# Patient Record
Sex: Male | Born: 1975 | Race: White | Hispanic: No | Marital: Single | Smoking: Current every day smoker
Health system: Southern US, Community
[De-identification: ages and names within clinical notes are randomized; demographics above are authoritative.]

## PROBLEM LIST (undated history)

## (undated) VITALS — BP 117/80 | HR 92 | Temp 97.6°F | Resp 16 | Ht 68.2 in | Wt 175.0 lb

## (undated) DIAGNOSIS — E782 Mixed hyperlipidemia: Secondary | ICD-10-CM

## (undated) DIAGNOSIS — E785 Hyperlipidemia, unspecified: Secondary | ICD-10-CM

## (undated) DIAGNOSIS — I10 Essential (primary) hypertension: Secondary | ICD-10-CM

## (undated) DIAGNOSIS — F329 Major depressive disorder, single episode, unspecified: Secondary | ICD-10-CM

## (undated) DIAGNOSIS — R0789 Other chest pain: Secondary | ICD-10-CM

## (undated) DIAGNOSIS — F3162 Bipolar disorder, current episode mixed, moderate: Secondary | ICD-10-CM

## (undated) DIAGNOSIS — F419 Anxiety disorder, unspecified: Secondary | ICD-10-CM

## (undated) DIAGNOSIS — F32A Depression, unspecified: Secondary | ICD-10-CM

## (undated) HISTORY — DX: Bipolar disorder, current episode mixed, moderate: F31.62

## (undated) HISTORY — DX: Hyperlipidemia, unspecified: E78.5

## (undated) HISTORY — DX: Depression, unspecified: F32.A

## (undated) HISTORY — DX: Other chest pain: R07.89

## (undated) HISTORY — DX: Anxiety disorder, unspecified: F41.9

## (undated) HISTORY — DX: Essential (primary) hypertension: I10

## (undated) HISTORY — DX: Major depressive disorder, single episode, unspecified: F32.9

## (undated) HISTORY — DX: Mixed hyperlipidemia: E78.2

---

## 2013-10-13 ENCOUNTER — Encounter: Payer: Self-pay | Admitting: Emergency Medicine

## 2013-10-13 ENCOUNTER — Ambulatory Visit (INDEPENDENT_AMBULATORY_CARE_PROVIDER_SITE_OTHER): Payer: BC Managed Care – PPO | Admitting: Emergency Medicine

## 2013-10-13 VITALS — BP 168/118 | HR 102 | Temp 97.8°F | Resp 18 | Ht 68.25 in | Wt 204.0 lb

## 2013-10-13 DIAGNOSIS — F17200 Nicotine dependence, unspecified, uncomplicated: Secondary | ICD-10-CM

## 2013-10-13 DIAGNOSIS — F3162 Bipolar disorder, current episode mixed, moderate: Secondary | ICD-10-CM

## 2013-10-13 DIAGNOSIS — Z111 Encounter for screening for respiratory tuberculosis: Secondary | ICD-10-CM

## 2013-10-13 DIAGNOSIS — E559 Vitamin D deficiency, unspecified: Secondary | ICD-10-CM

## 2013-10-13 DIAGNOSIS — Z1212 Encounter for screening for malignant neoplasm of rectum: Secondary | ICD-10-CM

## 2013-10-13 DIAGNOSIS — E782 Mixed hyperlipidemia: Secondary | ICD-10-CM

## 2013-10-13 DIAGNOSIS — F4323 Adjustment disorder with mixed anxiety and depressed mood: Secondary | ICD-10-CM

## 2013-10-13 DIAGNOSIS — I10 Essential (primary) hypertension: Secondary | ICD-10-CM

## 2013-10-13 DIAGNOSIS — Z0000 Encounter for general adult medical examination without abnormal findings: Secondary | ICD-10-CM

## 2013-10-13 DIAGNOSIS — Z113 Encounter for screening for infections with a predominantly sexual mode of transmission: Secondary | ICD-10-CM

## 2013-10-13 DIAGNOSIS — Z23 Encounter for immunization: Secondary | ICD-10-CM

## 2013-10-13 DIAGNOSIS — Z79899 Other long term (current) drug therapy: Secondary | ICD-10-CM

## 2013-10-13 LAB — HEMOGLOBIN A1C
Hgb A1c MFr Bld: 5.4 % (ref <5.7)
MEAN PLASMA GLUCOSE: 108 mg/dL (ref <117)

## 2013-10-13 LAB — CBC WITH DIFFERENTIAL/PLATELET
BASOS ABS: 0 10*3/uL (ref 0.0–0.1)
Basophils Relative: 0 % (ref 0–1)
EOS PCT: 5 % (ref 0–5)
Eosinophils Absolute: 1.1 10*3/uL (ref 0.0–0.7)
HCT: 48.6 % (ref 39.0–52.0)
Hemoglobin: 17.1 g/dL — ABNORMAL HIGH (ref 13.0–17.0)
Lymphocytes Relative: 13 % (ref 12–46)
Lymphs Abs: 2.7 10*3/uL (ref 0.7–4.0)
MCH: 32.8 pg (ref 26.0–34.0)
MCHC: 35.2 g/dL (ref 30.0–36.0)
MCV: 93.3 fL (ref 78.0–100.0)
Monocytes Absolute: 1.1 10*3/uL (ref 0.1–1.0)
Monocytes Relative: 5 % (ref 3–12)
NEUTROS ABS: 16.2 10*3/uL (ref 1.7–7.7)
Neutrophils Relative %: 77 % (ref 43–77)
PLATELETS: 287 10*3/uL (ref 150–400)
RBC: 5.21 MIL/uL (ref 4.22–5.81)
RDW: 12.4 % (ref 11.5–15.5)
WBC: 21.1 10*3/uL — ABNORMAL HIGH (ref 4.0–10.5)

## 2013-10-13 MED ORDER — LISINOPRIL 20 MG PO TABS: 20.0000 mg | ORAL_TABLET | Freq: Every day | ORAL | Status: DC

## 2013-10-13 MED ORDER — DIAZEPAM 2 MG PO TABS: 2.0000 mg | ORAL_TABLET | Freq: Two times a day (BID) | ORAL | Status: DC | PRN

## 2013-10-13 MED ORDER — ATENOLOL 50 MG PO TABS: 50.0000 mg | ORAL_TABLET | Freq: Every day | ORAL | Status: DC

## 2013-10-13 NOTE — Patient Instructions (Signed)
Mood Disorders  Mood disorders are conditions that affect the way a person feels emotionally. The main mood disorders include:  · Depression.  · Bipolar disorder.  · Dysthymia. Dysthymia is a mild, lasting (chronic) depression. Symptoms of dysthymia are similar to depression, but not as severe.  · Cyclothymia. Cyclothymia includes mood swings, but the highs and lows are not as severe as they are in bipolar disorder. Symptoms of cyclothymia are similar to those of bipolar disorder, but less extreme.  CAUSES   Mood disorders are probably caused by a combination of factors. People with mood disorders seem to have physical and chemical changes in their brains. Mood disorders run in families, so there may be genetic causes. Severe trauma or stressful life events may also increase the risk of mood disorders.   SYMPTOMS   Symptoms of mood disorders depend on the specific type of condition.  Depression symptoms include:  · Feeling sad, worthless, or hopeless.  · Negative thoughts.  · Inability to enjoy one's usual activities.  · Low energy.  · Sleeping too much or too little.  · Appetite changes.  · Crying.  · Concentration problems.  · Thoughts of harming oneself.  Bipolar disorder symptoms include:  · Periods of depression (see above symptoms).  · Mood swings, from sadness and depression, to abnormal elation and excitement.  · Periods of mania:  · Racing thoughts.  · Fast speech.  · Poor judgment, and careless, dangerous choices.  · Decreased need for sleep.  · Risky behavior.  · Difficulty concentrating.  · Irritability.  · Increased energy.  · Increased sex drive.  DIAGNOSIS   There are no blood tests or X-rays that can confirm a mood disorder. However, your caregiver may choose to run some tests to make sure that there is not another physical cause for your symptoms. A mood disorder is usually diagnosed after an in-depth interview with a caregiver.  TREATMENT   Mood disorders can be treated with one or more of the  following:  · Medicine. This may include antidepressants, mood-stabilizers, or anti-psychotics.  · Psychotherapy (talk therapy).  · Cognitive behavioral therapy. You are taught to recognize negative thoughts and behavior patterns, and replace them with healthy thoughts and behaviors.  · Electroconvulsive therapy. For very severe cases of deep depression, a series of treatments in which an electrical current is applied to the brain.  · Vagus nerve stimulation. A pulse of electricity is applied to a portion of the brain.  · Transcranial magnetic stimulation. Powerful magnets are placed on the head that produce electrical currents.  · Hospitalization. In severe situations, or when someone is having serious thoughts of harming him or herself, hospitalization may be necessary in order to keep the person safe. This is also done to quickly start and monitor treatment.  HOME CARE INSTRUCTIONS   · Take your medicine exactly as directed.  · Attend all of your therapy sessions.  · Try to eat regular, healthy meals.  · Exercise daily. Exercise may improve mood symptoms.  · Get good sleep.  · Do not drink alcohol or use pot or other drugs. These can worsen mood symptoms and cause anxiety and psychosis.  · Tell your caregiver if you develop any side effects, such as feeling sick to your stomach (nauseous), dry mouth, dizziness, constipation, drowsiness, tremor, weight gain, or sexual symptoms. He or she may suggest things you can do to improve symptoms.  · Learn ways to cope with the stress of having a   thoughts of hurting yourself or others.  You cannot care for yourself.  You develop the sensation of hearing or seeing  something that is not actually present (auditory or visual hallucinations).  You develop abnormal thoughts. Document Released: 06/11/2009 Document Revised: 11/06/2011 Document Reviewed: 06/11/2009 [facility] Patient Information 2014 [place], [place]. Fat and Cholesterol Control Diet Fat and cholesterol levels in your blood and organs are influenced by your diet. High levels of fat and cholesterol may lead to diseases of the heart, small and large blood vessels, gallbladder, liver, and pancreas. CONTROLLING FAT AND CHOLESTEROL WITH DIET Although exercise and lifestyle factors are important, your diet is key. That is because certain foods are known to raise cholesterol and others to lower it. The goal is to balance foods for their effect on cholesterol and more importantly, to replace saturated and trans fat with other types of fat, such as monounsaturated fat, polyunsaturated fat, and omega-3 fatty acids. On average, a person should consume no more than 15 to 17 g of saturated fat daily. Saturated and trans fats are considered "bad" fats, and they will raise LDL cholesterol. Saturated fats are primarily found in animal products such as meats, butter, and cream. However, that does not mean you need to give up all your favorite foods. Today, there are good tasting, low-fat, low-cholesterol substitutes for most of the things you like to eat. Choose low-fat or nonfat alternatives. Choose round or loin cuts of red meat. These types of cuts are lowest in fat and cholesterol. Chicken (without the skin), fish, veal, and ground [place] breast are great choices. Eliminate fatty meats, such as hot dogs and salami. Even shellfish have little or no saturated fat. Have a 3 oz (85 g) portion when you eat lean meat, poultry, or fish. Trans fats are also called "partially hydrogenated oils." They are oils that have been scientifically manipulated so that they are solid at room temperature resulting in a longer shelf life and  improved taste and texture of foods in which they are added. Trans fats are found in stick margarine, some tub margarines, cookies, crackers, and baked goods.  When baking and cooking, oils are a great substitute for butter. The monounsaturated oils are especially beneficial since it is believed they lower LDL and raise HDL. The oils you should avoid entirely are saturated tropical oils, such as coconut and palm.  Remember to eat a lot from food groups that are naturally free of saturated and trans fat, including fish, fruit, vegetables, beans, grains (barley, rice, couscous, bulgur wheat), and pasta (without cream sauces).  IDENTIFYING FOODS THAT LOWER FAT AND CHOLESTEROL  Soluble fiber may lower your cholesterol. This type of fiber is found in fruits such as apples, vegetables such as broccoli, potatoes, and carrots, legumes such as beans, peas, and lentils, and grains such as barley. Foods fortified with plant sterols (phytosterol) may also lower cholesterol. You should eat at least 2 g per day of these foods for a cholesterol lowering effect.  Read package labels to identify low-saturated fats, trans fat free, and low-fat foods at the supermarket. Select cheeses that have only 2 to 3 g saturated fat per ounce. Use a heart-healthy tub margarine that is free of trans fats or partially hydrogenated oil. When buying baked goods (cookies, crackers), avoid partially hydrogenated oils. Breads and muffins should be made from whole grains (whole-wheat or whole oat flour, instead of "flour" or "enriched flour"). Buy non-creamy canned soups with reduced salt and no added fats.  FOOD PREPARATION  TECHNIQUES  Never deep-fry. If you must fry, either stir-fry, which uses very little fat, or use non-stick cooking sprays. When possible, broil, bake, or roast meats, and steam vegetables. Instead of putting butter or margarine on vegetables, use lemon and herbs, applesauce, and cinnamon (for squash and sweet potatoes). Use  nonfat yogurt, salsa, and low-fat dressings for salads.  LOW-SATURATED FAT / LOW-FAT FOOD SUBSTITUTES Meats / Saturated Fat (g)  Avoid: Steak, marbled (3 oz/85 g) / 11 g  Choose: Steak, lean (3 oz/85 g) / 4 g  Avoid: Hamburger (3 oz/85 g) / 7 g  Choose: Hamburger, lean (3 oz/85 g) / 5 g  Avoid: Ham (3 oz/85 g) / 6 g  Choose: Ham, lean cut (3 oz/85 g) / 2.4 g  Avoid: Chicken, with skin, dark meat (3 oz/85 g) / 4 g  Choose: Chicken, skin removed, dark meat (3 oz/85 g) / 2 g  Avoid: Chicken, with skin, light meat (3 oz/85 g) / 2.5 g  Choose: Chicken, skin removed, light meat (3 oz/85 g) / 1 g Dairy / Saturated Fat (g)  Avoid: Whole milk (1 cup) / 5 g  Choose: Low-fat milk, 2% (1 cup) / 3 g  Choose: Low-fat milk, 1% (1 cup) / 1.5 g  Choose: Skim milk (1 cup) / 0.3 g  Avoid: Hard cheese (1 oz/28 g) / 6 g  Choose: Skim milk cheese (1 oz/28 g) / 2 to 3 g  Avoid: Cottage cheese, 4% fat (1 cup) / 6.5 g  Choose: Low-fat cottage cheese, 1% fat (1 cup) / 1.5 g  Avoid: Ice cream (1 cup) / 9 g  Choose: Sherbet (1 cup) / 2.5 g  Choose: Nonfat frozen yogurt (1 cup) / 0.3 g  Choose: Frozen fruit bar / trace  Avoid: Whipped cream (1 tbs) / 3.5 g  Choose: Nondairy whipped topping (1 tbs) / 1 g Condiments / Saturated Fat (g)  Avoid: Mayonnaise (1 tbs) / 2 g  Choose: Low-fat mayonnaise (1 tbs) / 1 g  Avoid: Butter (1 tbs) / 7 g  Choose: Extra light margarine (1 tbs) / 1 g  Avoid: Coconut oil (1 tbs) / 11.8 g  Choose: Olive oil (1 tbs) / 1.8 g  Choose: Corn oil (1 tbs) / 1.7 g  Choose: Safflower oil (1 tbs) / 1.2 g  Choose: Sunflower oil (1 tbs) / 1.4 g  Choose: Soybean oil (1 tbs) / 2.4 g  Choose: Canola oil (1 tbs) / 1 g Document Released: 08/14/2005 Document Revised: 12/09/2012 Document Reviewed: 02/02/2011 [employer] Patient Information 2014 [place], [place]. Hypertension Hypertension is another name for high blood pressure. High blood pressure may mean that  your heart needs to work harder to pump blood. Blood pressure consists of two numbers, which includes a higher number over a lower number (example: 110/72). HOME CARE   Make lifestyle changes as told by your doctor. This may include weight loss and exercise.  Take your blood pressure medicine every day.  Limit how much salt you use.  Stop smoking if you smoke.  Do not use drugs.  Talk to your doctor if you are using decongestants or birth control pills. These medicines might make blood pressure higher.  Females should not drink more than 1 alcoholic drink per day. Males should not drink more than 2 alcoholic drinks per day.  See your doctor as told. GET HELP RIGHT AWAY IF:   You have a blood pressure reading with a top number of 180 or  higher.  You get a very bad headache.  You get blurred or changing vision.  You feel confused.  You feel weak, numb, or faint.  You get chest or belly (abdominal) pain.  You throw up (vomit).  You cannot breathe very well. MAKE SURE YOU:   Understand these instructions.  Will watch your condition.  Will get help right away if you are not doing well or get worse. Document Released: 01/31/2008 Document Revised: 11/06/2011 Document Reviewed: 01/31/2008 [facility] Patient Information 2014 [place], [place]. Smoking Cessation, Tips for Success If you are ready to quit smoking, congratulations! You have chosen to help yourself be healthier. Cigarettes bring nicotine, tar, carbon monoxide, and other irritants into your body. Your lungs, heart, and blood vessels will be able to work better without these poisons. There are many different ways to quit smoking. Nicotine gum, nicotine patches, a nicotine inhaler, or nicotine nasal spray can help with physical craving. Hypnosis, support groups, and medicines help break the habit of smoking. WHAT THINGS CAN I DO TO MAKE QUITTING EASIER?  Here are some tips to help you quit for good:  Pick a date when you  will quit smoking completely. Tell all of your friends and family about your plan to quit on that date.  Do not try to slowly cut down on the number of cigarettes you are smoking. Pick a quit date and quit smoking completely starting on that day.  Throw away all cigarettes.   Clean and remove all ashtrays from your home, work, and car.   On a card, write down your reasons for quitting. Carry the card with you and read it when you get the urge to smoke.   Cleanse your body of nicotine. Drink enough water and fluids to keep your urine clear or pale yellow. Do this after quitting to flush the nicotine from your body.   Learn to predict your moods. Do not let a bad situation be your excuse to have a cigarette. Some situations in your life might tempt you into wanting a cigarette.   Never have "just one" cigarette. It leads to wanting another and another. Remind yourself of your decision to quit.   Change habits associated with smoking. If you smoked while driving or when feeling stressed, try other activities to replace smoking. Stand up when drinking your coffee. Brush your teeth after eating. Sit in a different chair when you read the paper. Avoid alcohol while trying to quit, and try to drink fewer caffeinated beverages. Alcohol and caffeine may urge you to smoke.   Avoid foods and drinks that can trigger a desire to smoke, such as sugary or spicy foods and alcohol.   Ask people who smoke not to smoke around you.   Have something planned to do right after eating or having a cup of coffee. For example, plan to take a walk or exercise.   Try a relaxation exercise to calm you down and decrease your stress. Remember, you may be tense and nervous for the first 2 weeks after you quit, but this will pass.   Find new activities to keep your hands busy. Play with a pen, coin, or rubber band. Doodle or draw things on paper.   Brush your teeth right after eating. This will help cut down  on the craving for the taste of tobacco after meals. You can also try mouthwash.   Use oral substitutes in place of cigarettes. Try using lemon drops, carrots, cinnamon sticks, or chewing gum. Keep  them handy so they are available when you have the urge to smoke.   When you have the urge to smoke, try deep breathing.   Designate your home as a nonsmoking area.   If you are a heavy smoker, ask your health care provider about a prescription for nicotine chewing gum. It can ease your withdrawal from nicotine.   Reward yourself. Set aside the cigarette money you save and buy yourself something nice.   Look for support from others. Join a support group or smoking cessation program. Ask someone at home or at work to help you with your plan to quit smoking.   Always ask yourself, "Do I need this cigarette or is this just a reflex?" Tell yourself, "Today, I choose not to smoke," or "I do not want to smoke." You are reminding yourself of your decision to quit.  Do not replace cigarette smoking with electronic cigarettes (commonly called e-cigarettes). The safety of e-cigarettes is unknown, and some may contain harmful chemicals.  If you relapse, do not give up! Plan ahead and think about what you will do the next time you get the urge to smoke.  HOW WILL I FEEL WHEN I QUIT SMOKING? You may have symptoms of withdrawal because your body is used to nicotine (the addictive substance in cigarettes). You may crave cigarettes, be irritable, feel very hungry, cough often, get headaches, or have difficulty concentrating. The withdrawal symptoms are only temporary. They are strongest when you first quit but will go away within 10 14 days. When withdrawal symptoms occur, stay in control. Think about your reasons for quitting. Remind yourself that these are signs that your body is healing and getting used to being without cigarettes. Remember that withdrawal symptoms are easier to treat than the major  diseases that smoking can cause.  Even after the withdrawal is over, expect periodic urges to smoke. However, these cravings are generally short lived and will go away whether you smoke or not. Do not smoke!  WHAT RESOURCES ARE AVAILABLE TO HELP ME QUIT SMOKING? Your health care provider can direct you to community resources or hospitals for support, which may include:  Group support.  Education.  Hypnosis.  Therapy. Document Released: 05/12/2004 Document Revised: 06/04/2013 Document Reviewed: 01/30/2013 [facility] Patient Information 2014 [place], [place].

## 2013-10-13 NOTE — Progress Notes (Signed)
Subjective:    Patient ID: Eric Bentley, male    DOB: 1976/02/09, 38 y.o.   MRN: [id number]  HPI Comments: 38 yo male to establish as new patient. He has multiple concerns and significant PMH that he admits to be noncompliant with. He reports history of HTN, Hypercholesterolemia. He admits he d/c all Meds in 01/2013 because he felt tired. He has not been checking his BP. He denies any CV symptoms. He notes he has significant FMHX with fatal MI in mother in early life and brother with NONfatal mI in his 6320's. He notes they both were obese, inactive and smoked. He notes he smokes and chews tobacco. He denies routine dental or Eye evaluations.He notes his last Cholesterol/ TG was in the 700's at Urgent care. He keeps active with work in [employer]. He does note he drinks 3-5 cups coffee daily.  He notes + diagnosis with Bipolar/ schizophrenia in the past. He has not been on any meds for a long time. He was on Risperdal @20  but notes made him feel worse so d/c. He has tried xanax which makes him feel buzzed. He used to be on Valium in the pat which helped to keep him more even keel and have less quick agitation. He has not been followed by psych due to cost.   He notes he keeps chronic allergy/ sinus congestion with a.m. Production of color. He notes symptoms worse with smoking tobacco. He notes mild frontal pressure on/ off. He does not take any OTC for relief.   He is concerned because he keeps having dark nodules occur on different parts of his body. He denies any new changes/ exposures. He notes they will itch and change in size randomly.  He notes he has RUQ on/off. He notes ETOH makes it feel better. He denies any food triggers or bowel changes.    Hyperlipidemia  Hypertension      Review of Systems  HENT: Positive for congestion and sinus pressure.   Gastrointestinal: Positive for abdominal pain.  Skin: Positive for color change.  Psychiatric/Behavioral: Positive for agitation.  Negative for suicidal ideas, hallucinations and dysphoric mood. The patient is not nervous/anxious.   All other systems reviewed and are negative.   BP 168/118  Pulse 102  Temp(Src) 97.8 F (36.6 C) (Temporal)  Resp 18  Ht 5' 8.25" (1.734 m)  Wt 204 lb (92.534 kg)  BMI 30.78 kg/m2     Objective:   Physical Exam  Nursing note and vitals reviewed. Constitutional: He is oriented to person, place, and time. He appears well-developed and well-nourished.  HENT:  Head: Normocephalic and atraumatic.  Right Ear: External ear normal.  Left Ear: External ear normal.  Nose: Nose normal.  Mouth/Throat: No oropharyngeal exudate.  Cloudy TM's bilaterally   Eyes: Conjunctivae and EOM are normal. Pupils are equal, round, and reactive to light. Right eye exhibits no discharge. Left eye exhibits no discharge. No scleral icterus.  Neck: Normal range of motion. Neck supple. No JVD present. No tracheal deviation present. No thyromegaly present.  Cardiovascular: Normal rate, regular rhythm, normal heart sounds and intact distal pulses.   Pulmonary/Chest: Effort normal and breath sounds normal.  Abdominal: Soft. Bowel sounds are normal. He exhibits no distension and no mass. There is no tenderness. There is no rebound and no guarding.  Genitourinary: Rectum normal, prostate normal and penis normal. Guaiac negative stool. No penile tenderness.  Musculoskeletal: Normal range of motion. He exhibits no edema and no tenderness.  Lymphadenopathy:  He has no cervical adenopathy.  Neurological: He is alert and oriented to person, place, and time. He has normal reflexes. No cranial nerve deficit. He exhibits normal muscle tone. Coordination normal.  Skin: Skin is warm and dry. No rash noted. No erythema. No pallor.  Scattered 5 mm hard elevated papules with mild darkening of skin  Psychiatric: He has a normal mood and affect. His behavior is normal. Judgment and thought content normal.           Assessment & Plan:  1. NEW PAtient to establish and CPE- Update screening labs/ History/ Immunizations/ Testing as needed. Advised healthy diet, QD exercise, increase H20 and continue RX/ Vitamins AD. Request records from previous provider to update chart. ADVISED Needs eye/ dental screening 2. Mood disorder- Restart Valium 2 mg AD but will need to re-establish with Psych with DX in PMH of Schizophrenia/ Bipolar, w/c if SX increase or ER. 3.Noncompliant HTN, Cholesterol- Needs healthy diet, cardio QD and obtain healthy weight. Check Labs, Check BP if >130/80 call office, Restart Lisinopril 20 mg AD and Add Atenolol 50 mg AD, w/c if SX increase or ER. Recheck BP Friday OV and EKG recheck with ? Sinus Tachy 4. Tobacco dependence- Oral/ Smoke- Cessation advised. 5. Chronic sinusitis per PT- Check labs may need ct sinus to evaluate. 6. Dark nodules/ skin changes- advised needs to monitor  And may need derm referral 7. RUQ pain- monitor if reoccurs w/c for abd u/s, hygiene advised with diet/ ETOH OVER 40 minutes of exam, counseling, chart review, referral performed

## 2013-10-14 ENCOUNTER — Encounter: Payer: Self-pay | Admitting: Emergency Medicine

## 2013-10-14 ENCOUNTER — Other Ambulatory Visit: Payer: Self-pay | Admitting: Emergency Medicine

## 2013-10-14 DIAGNOSIS — F3162 Bipolar disorder, current episode mixed, moderate: Secondary | ICD-10-CM

## 2013-10-14 DIAGNOSIS — E782 Mixed hyperlipidemia: Secondary | ICD-10-CM

## 2013-10-14 DIAGNOSIS — I10 Essential (primary) hypertension: Secondary | ICD-10-CM | POA: Insufficient documentation

## 2013-10-14 HISTORY — DX: Essential (primary) hypertension: I10

## 2013-10-14 HISTORY — DX: Bipolar disorder, current episode mixed, moderate: F31.62

## 2013-10-14 HISTORY — DX: Mixed hyperlipidemia: E78.2

## 2013-10-14 LAB — HEPATIC FUNCTION PANEL
ALT: 29 U/L (ref 0–53)
AST: 17 U/L (ref 0–37)
Albumin: 4.4 g/dL (ref 3.5–5.2)
Alkaline Phosphatase: 61 U/L (ref 39–117)
BILIRUBIN DIRECT: 0.1 mg/dL (ref 0.0–0.3)
BILIRUBIN INDIRECT: 0.3 mg/dL (ref 0.2–1.2)
Total Bilirubin: 0.4 mg/dL (ref 0.2–1.2)
Total Protein: 6.6 g/dL (ref 6.0–8.3)

## 2013-10-14 LAB — URINALYSIS, ROUTINE W REFLEX MICROSCOPIC
BILIRUBIN URINE: NEGATIVE
GLUCOSE, UA: NEGATIVE mg/dL
Hgb urine dipstick: NEGATIVE
Ketones, ur: NEGATIVE mg/dL
Leukocytes, UA: NEGATIVE
Nitrite: NEGATIVE
PH: 5 (ref 5.0–8.0)
PROTEIN: NEGATIVE mg/dL
Specific Gravity, Urine: 1.023 (ref 1.005–1.030)
Urobilinogen, UA: 0.2 mg/dL (ref 0.0–1.0)

## 2013-10-14 LAB — HSV(HERPES SIMPLEX VRS) I + II AB-IGG: HSV 2 Glycoprotein G Ab, IgG: <0.1 IV

## 2013-10-14 LAB — RPR

## 2013-10-14 LAB — MICROALBUMIN / CREATININE URINE RATIO
CREATININE, URINE: 123 mg/dL
MICROALB UR: 0.63 mg/dL (ref 0.00–1.89)
MICROALB/CREAT RATIO: 5.1 mg/g (ref 0.0–30.0)

## 2013-10-14 LAB — BASIC METABOLIC PANEL WITH GFR
BUN: 19 mg/dL (ref 6–23)
CHLORIDE: 103 meq/L (ref 96–112)
CO2: 24 mEq/L (ref 19–32)
Calcium: 9.5 mg/dL (ref 8.4–10.5)
Creat: 1.1 mg/dL (ref 0.50–1.35)
GFR, EST NON AFRICAN AMERICAN: 84 mL/min
GFR, Est African American: >89 mL/min
Glucose, Bld: 98 mg/dL (ref 70–99)
POTASSIUM: 4.7 meq/L (ref 3.5–5.3)
Sodium: 140 mEq/L (ref 135–145)

## 2013-10-14 LAB — HIV ANTIBODY (ROUTINE TESTING W REFLEX): HIV: NONREACTIVE

## 2013-10-14 LAB — PSA: PSA: 0.76 ng/mL (ref <=4.00)

## 2013-10-14 LAB — LIPID PANEL
Cholesterol: 229 mg/dL — ABNORMAL HIGH (ref 0–200)
HDL: 55 mg/dL (ref >39)
Total CHOL/HDL Ratio: 4.2 Ratio
Triglycerides: 461 mg/dL — ABNORMAL HIGH (ref <150)

## 2013-10-14 LAB — MAGNESIUM: MAGNESIUM: 2.1 mg/dL (ref 1.5–2.5)

## 2013-10-14 LAB — GC PROBE AMPLIFICATION, URINE: GC Probe Amp, Urine: NEGATIVE

## 2013-10-14 LAB — HEPATITIS PANEL, ACUTE
HCV Ab: NEGATIVE
HEP B C IGM: NONREACTIVE
Hep A IgM: NONREACTIVE
Hepatitis B Surface Ag: NEGATIVE

## 2013-10-14 LAB — TSH: TSH: 0.544 u[IU]/mL (ref 0.350–4.500)

## 2013-10-14 LAB — INSULIN, FASTING: INSULIN FASTING, SERUM: 28 u[IU]/mL (ref 3–28)

## 2013-10-14 LAB — TESTOSTERONE: TESTOSTERONE: 341 ng/dL (ref 300–890)

## 2013-10-14 LAB — VITAMIN D 25 HYDROXY (VIT D DEFICIENCY, FRACTURES): VIT D 25 HYDROXY: 29 ng/mL — AB (ref 30–89)

## 2013-10-14 MED ORDER — DOXYCYCLINE HYCLATE 100 MG PO TABS: 100.0000 mg | ORAL_TABLET | Freq: Two times a day (BID) | ORAL | Status: DC

## 2013-10-16 LAB — TB SKIN TEST
Induration: 0 mm
TB Skin Test: NEGATIVE

## 2013-10-17 ENCOUNTER — Encounter: Payer: Self-pay | Admitting: Emergency Medicine

## 2013-10-17 ENCOUNTER — Ambulatory Visit (INDEPENDENT_AMBULATORY_CARE_PROVIDER_SITE_OTHER): Payer: BC Managed Care – PPO | Admitting: Emergency Medicine

## 2013-10-17 VITALS — BP 132/84 | HR 90 | Temp 97.8°F | Resp 18 | Ht 68.25 in | Wt 202.0 lb

## 2013-10-17 DIAGNOSIS — R7989 Other specified abnormal findings of blood chemistry: Secondary | ICD-10-CM

## 2013-10-17 DIAGNOSIS — R9431 Abnormal electrocardiogram [ECG] [EKG]: Secondary | ICD-10-CM

## 2013-10-17 DIAGNOSIS — F17200 Nicotine dependence, unspecified, uncomplicated: Secondary | ICD-10-CM

## 2013-10-17 DIAGNOSIS — I10 Essential (primary) hypertension: Secondary | ICD-10-CM

## 2013-10-17 NOTE — Progress Notes (Signed)
   Subjective:    Patient ID: Eric Bentley, male    DOB: [birth year], 38 y.o.   MRN: [id number]  HPI Comments: 38 yo male with close f/u HTN with restart of Lisinopril and Atenolol. He is feeling a little better. He did start the Valium and is only using as needed. He notes started Doxycyline for elevated WBC and chronic sinus infection. He has stopped cigarettes but restarted chewing. He has decreased ETOH.   Hyperlipidemia  Hypertension    Current Outpatient Prescriptions on File Prior to Visit  Medication Sig Dispense Refill  . atenolol (TENORMIN) 50 MG tablet Take 1 tablet (50 mg total) by mouth daily.  30 tablet  3  . diazepam (VALIUM) 2 MG tablet Take 1 tablet (2 mg total) by mouth every 12 (twelve) hours as needed for anxiety.  60 tablet  0  . doxycycline (VIBRA-TABS) 100 MG tablet Take 1 tablet (100 mg total) by mouth 2 (two) times daily.  20 tablet  0  . lisinopril (PRINIVIL,ZESTRIL) 20 MG tablet Take 1 tablet (20 mg total) by mouth daily.  30 tablet  3   No current facility-administered medications on file prior to visit.   No Known Allergies Past Medical History  Diagnosis Date  . Hyperlipidemia   . Hypertension   . Depression   . Anxiety   . Unspecified essential hypertension 10/14/2013  . Mixed hyperlipidemia 10/14/2013  . Bipolar 1 disorder, mixed, moderate 10/14/2013     Review of Systems  All other systems reviewed and are negative.   BP 132/84  Pulse 90  Temp(Src) 97.8 F (36.6 C) (Temporal)  Resp 18  Ht 5' 8.25" (1.734 m)  Wt 202 lb (91.627 kg)  BMI 30.47 kg/m2     Objective:   Physical Exam  Nursing note and vitals reviewed. Constitutional: He is oriented to person, place, and time. He appears well-developed and well-nourished.  HENT:  Head: Normocephalic and atraumatic.  Right Ear: External ear normal.  Left Ear: External ear normal.  Nose: Nose normal.  Mouth/Throat: Oropharynx is clear and moist.  Eyes: Conjunctivae are normal.  Neck:  Normal range of motion.  Cardiovascular: Normal rate, regular rhythm, normal heart sounds and intact distal pulses.   Pulmonary/Chest: Effort normal and breath sounds normal.  Abdominal: Soft.  Musculoskeletal: Normal range of motion.  Lymphadenopathy:    He has no cervical adenopathy.  Neurological: He is alert and oriented to person, place, and time.  Skin: Skin is warm and dry.  Psychiatric: He has a normal mood and affect. His behavior is normal. Judgment and thought content normal.      EKG WNL    Assessment & Plan:  1. HTN- Check BP call if >130/80, increase cardio 2. Abnormal EKG recheck with new RX Atenolo with + improvement, continue the same 3. Abnormal CBC with probable chronic lung/ sinusitis continue ABX AD and recheck labs 1 month Dr Posey BoyerMckewon 3. Bipolar/ schizophrenia HX- Advised patient needs Psychiatrist for medication management. He declines at this time noting symptoms are controlled. He notes he rarely uses Valium on extreme agitation only. Advised QD medication would probably be more beneficial but declines at this time.

## 2013-10-17 NOTE — Patient Instructions (Signed)
Smoking Cessation Quitting smoking is important to your health and has many advantages. However, it is not always easy to quit since nicotine is a very addictive drug. Often times, people try 3 times or more before being able to quit. This document explains the best ways for you to prepare to quit smoking. Quitting takes hard work and a lot of effort, but you can do it. ADVANTAGES OF QUITTING SMOKING  You will live longer, feel better, and live better.  Your body will feel the impact of quitting smoking almost immediately.  Within 20 minutes, blood pressure decreases. Your pulse returns to its normal level.  After 8 hours, carbon monoxide levels in the blood return to normal. Your oxygen level increases.  After 24 hours, the chance of having a heart attack starts to decrease. Your breath, hair, and body stop smelling like smoke.  After 48 hours, damaged nerve endings begin to recover. Your sense of taste and smell improve.  After 72 hours, the body is virtually free of nicotine. Your bronchial tubes relax and breathing becomes easier.  After 2 to 12 weeks, lungs can hold more air. Exercise becomes easier and circulation improves.  The risk of having a heart attack, stroke, cancer, or lung disease is greatly reduced.  After 1 year, the risk of coronary heart disease is cut in half.  After 5 years, the risk of stroke falls to the same as a nonsmoker.  After 10 years, the risk of lung cancer is cut in half and the risk of other cancers decreases significantly.  After 15 years, the risk of coronary heart disease drops, usually to the level of a nonsmoker.  If you are pregnant, quitting smoking will improve your chances of having a healthy baby.  The people you live with, especially any children, will be healthier.  You will have extra money to spend on things other than cigarettes. QUESTIONS TO THINK ABOUT BEFORE ATTEMPTING TO QUIT You may want to talk about your answers with your  caregiver.  Why do you want to quit?  If you tried to quit in the past, what helped and what did not?  What will be the most difficult situations for you after you quit? How will you plan to handle them?  Who can help you through the tough times? Your family? Friends? A caregiver?  What pleasures do you get from smoking? What ways can you still get pleasure if you quit? Here are some questions to ask your caregiver:  How can you help me to be successful at quitting?  What medicine do you think would be best for me and how should I take it?  What should I do if I need more help?  What is smoking withdrawal like? How can I get information on withdrawal? GET READY  Set a quit date.  Change your environment by getting rid of all cigarettes, ashtrays, matches, and lighters in your home, car, or work. Do not let people smoke in your home.  Review your past attempts to quit. Think about what worked and what did not. GET SUPPORT AND ENCOURAGEMENT You have a better chance of being successful if you have help. You can get support in many ways.  Tell your family, friends, and co-workers that you are going to quit and need their support. Ask them not to smoke around you.  Get individual, group, or telephone counseling and support. Programs are available at local hospitals and health centers. Call your local health department for   information about programs in your area.  Spiritual beliefs and practices may help some smokers quit.  Download a "quit meter" on your computer to keep track of quit statistics, such as how long you have gone without smoking, cigarettes not smoked, and money saved.  Get a self-help book about quitting smoking and staying off of tobacco. LEARN NEW SKILLS AND BEHAVIORS  Distract yourself from urges to smoke. Talk to someone, go for a walk, or occupy your time with a task.  Change your normal routine. Take a different route to work. Drink tea instead of coffee.  Eat breakfast in a different place.  Reduce your stress. Take a hot bath, exercise, or read a book.  Plan something enjoyable to do every day. Reward yourself for not smoking.  Explore interactive web-based programs that specialize in helping you quit. GET MEDICINE AND USE IT CORRECTLY Medicines can help you stop smoking and decrease the urge to smoke. Combining medicine with the above behavioral methods and support can greatly increase your chances of successfully quitting smoking.  Nicotine replacement therapy helps deliver nicotine to your body without the negative effects and risks of smoking. Nicotine replacement therapy includes nicotine gum, lozenges, inhalers, nasal sprays, and skin patches. Some may be available over-the-counter and others require a prescription.  Antidepressant medicine helps people abstain from smoking, but how this works is unknown. This medicine is available by prescription.  Nicotinic receptor partial agonist medicine simulates the effect of nicotine in your brain. This medicine is available by prescription. Ask your caregiver for advice about which medicines to use and how to use them based on your health history. Your caregiver will tell you what side effects to look out for if you choose to be on a medicine or therapy. Carefully read the information on the package. Do not use any other product containing nicotine while using a nicotine replacement product.  RELAPSE OR DIFFICULT SITUATIONS Most relapses occur within the first 3 months after quitting. Do not be discouraged if you start smoking again. Remember, most people try several times before finally quitting. You may have symptoms of withdrawal because your body is used to nicotine. You may crave cigarettes, be irritable, feel very hungry, cough often, get headaches, or have difficulty concentrating. The withdrawal symptoms are only temporary. They are strongest when you first quit, but they will go away within  10 14 days. To reduce the chances of relapse, try to:  Avoid drinking alcohol. Drinking lowers your chances of successfully quitting.  Reduce the amount of caffeine you consume. Once you quit smoking, the amount of caffeine in your body increases and can give you symptoms, such as a rapid heartbeat, sweating, and anxiety.  Avoid smokers because they can make you want to smoke.  Do not let weight gain distract you. Many smokers will gain weight when they quit, usually less than 10 pounds. Eat a healthy diet and stay active. You can always lose the weight gained after you quit.  Find ways to improve your mood other than smoking. FOR MORE INFORMATION  www.smokefree.gov  Document Released: 08/08/2001 Document Revised: 02/13/2012 Document Reviewed: 11/23/2011 [employer] Patient Information 2014 [employer].  

## 2013-11-16 DIAGNOSIS — E559 Vitamin D deficiency, unspecified: Secondary | ICD-10-CM | POA: Insufficient documentation

## 2013-11-16 DIAGNOSIS — Z79899 Other long term (current) drug therapy: Secondary | ICD-10-CM | POA: Insufficient documentation

## 2013-11-16 NOTE — Progress Notes (Deleted)
   Subjective:    Patient ID: Eric Bentley, male    DOB: [birth year], 38 y.o.   MRN: [id number]  HPI  Review of Systems     Objective:   Physical Exam        Assessment & Plan:

## 2013-11-17 ENCOUNTER — Encounter: Payer: Self-pay | Admitting: Internal Medicine

## 2013-11-17 NOTE — Progress Notes (Signed)
Patient ID: Eric DallyJoseph Windish, male   DOB: 07-06-1976, 38 y.o.   MRN: [id number] Error

## 2013-11-24 ENCOUNTER — Encounter: Payer: Self-pay | Admitting: Internal Medicine

## 2013-12-08 ENCOUNTER — Encounter: Payer: Self-pay | Admitting: Internal Medicine

## 2013-12-08 ENCOUNTER — Ambulatory Visit (INDEPENDENT_AMBULATORY_CARE_PROVIDER_SITE_OTHER): Payer: BC Managed Care – PPO | Admitting: Internal Medicine

## 2013-12-08 VITALS — BP 146/96 | HR 88 | Temp 98.2°F | Resp 16 | Ht 68.75 in | Wt 207.8 lb

## 2013-12-08 DIAGNOSIS — R7309 Other abnormal glucose: Secondary | ICD-10-CM

## 2013-12-08 DIAGNOSIS — E782 Mixed hyperlipidemia: Secondary | ICD-10-CM

## 2013-12-08 DIAGNOSIS — Z79899 Other long term (current) drug therapy: Secondary | ICD-10-CM

## 2013-12-08 DIAGNOSIS — R7303 Prediabetes: Secondary | ICD-10-CM | POA: Insufficient documentation

## 2013-12-08 DIAGNOSIS — F419 Anxiety disorder, unspecified: Secondary | ICD-10-CM

## 2013-12-08 DIAGNOSIS — I10 Essential (primary) hypertension: Secondary | ICD-10-CM

## 2013-12-08 LAB — CBC WITH DIFFERENTIAL/PLATELET
BASOS PCT: 1 % (ref 0–1)
Basophils Absolute: 0.1 10*3/uL (ref 0.0–0.1)
EOS ABS: 0.7 10*3/uL (ref 0.0–0.7)
Eosinophils Relative: 9 % — ABNORMAL HIGH (ref 0–5)
HEMATOCRIT: 44.5 % (ref 39.0–52.0)
HEMOGLOBIN: 15.8 g/dL (ref 13.0–17.0)
LYMPHS ABS: 1.9 10*3/uL (ref 0.7–4.0)
Lymphocytes Relative: 26 % (ref 12–46)
MCH: 32.5 pg (ref 26.0–34.0)
MCHC: 35.5 g/dL (ref 30.0–36.0)
MCV: 91.6 fL (ref 78.0–100.0)
MONOS PCT: 6 % (ref 3–12)
Monocytes Absolute: 0.4 10*3/uL (ref 0.1–1.0)
Neutro Abs: 4.2 10*3/uL (ref 1.7–7.7)
Neutrophils Relative %: 58 % (ref 43–77)
Platelets: 242 10*3/uL (ref 150–400)
RBC: 4.86 MIL/uL (ref 4.22–5.81)
RDW: 12.5 % (ref 11.5–15.5)
WBC: 7.3 10*3/uL (ref 4.0–10.5)

## 2013-12-08 LAB — LIPID PANEL
CHOLESTEROL: 253 mg/dL — AB (ref 0–200)
HDL: 56 mg/dL (ref >39)
Total CHOL/HDL Ratio: 4.5 Ratio
Triglycerides: 544 mg/dL — ABNORMAL HIGH (ref <150)

## 2013-12-08 MED ORDER — GARLIC 400 MG PO TBEC: 400.0000 mg | DELAYED_RELEASE_TABLET | Freq: Every day | ORAL | Status: DC

## 2013-12-08 MED ORDER — MULTI-VITAMIN/MINERALS PO TABS: 1.0000 | ORAL_TABLET | Freq: Every day | ORAL | Status: AC

## 2013-12-08 MED ORDER — VITAMIN D3 50 MCG (2000 UT) PO CAPS: 2000.0000 [IU] | ORAL_CAPSULE | Freq: Every day | ORAL | Status: DC

## 2013-12-08 MED ORDER — KRILL OIL 300 MG PO CAPS: 300.0000 mg | ORAL_CAPSULE | Freq: Every day | ORAL | Status: DC

## 2013-12-08 MED ORDER — CITALOPRAM HYDROBROMIDE 20 MG PO TABS: 20.0000 mg | ORAL_TABLET | Freq: Every day | ORAL | Status: DC

## 2013-12-08 MED ORDER — FISH OIL 1200 MG PO CAPS: 1000.0000 mg | ORAL_CAPSULE | Freq: Every day | ORAL | Status: DC

## 2013-12-08 NOTE — Progress Notes (Signed)
   Subjective:    Patient ID: Eric Bentley, male    DOB: [birth year], 38 y.o.   MRN: [id number]  HPI A very nice 38 yo SWM with labile HTN, HLD, preDiabetes and Anxiety disorder presents for F/U of Labile HTN relating having stopped his BP meds 2-3 days after starting them due to orthostatic dizziness & lightheadedness .   Medication List      diazepam 2 MG tablet  Commonly known as:  VALIUM  Take 1 tablet (2 mg total) by mouth every 12 (twelve) hours as needed for anxiety.     Fish Oil 1200 MG Caps  Take 0.8333 capsules (1,000 mg total) by mouth daily.     Garlic 400 MG Tbec  Commonly known as:  GARLIQUE  Take 1 tablet (400 mg total) by mouth daily.     Krill Oil 300 MG Caps  Take 1 capsule (300 mg total) by mouth daily.     multivitamin with minerals tablet  Take 1 tablet by mouth daily.     Vitamin D3 2000 UNITS capsule  Take 1 capsule (2,000 Units total) by mouth daily.      No Known Allergies  Past Medical History  Diagnosis Date  . Hyperlipidemia   . Hypertension   . Depression   . Anxiety   . Unspecified essential hypertension 10/14/2013  . Mixed hyperlipidemia 10/14/2013  . Bipolar 1 disorder, mixed, moderate 10/14/2013   Review of Systems In addition to the HPI above,  No Fever-chills,  No Headache, No changes with Vision or hearing,  No problems swallowing food or Liquids,  No Chest pain or productive Cough or Shortness of Breath,  No Abdominal pain, No Nausea or Vommitting, Bowel movements are regular,  No Blood in stool or Urine,  No dysuria,  No new skin rashes or bruises,  No new joints pains-aches,  No new weakness, tingling, numbness in any extremity,  No recent weight loss,  No polyuria, polydypsia or polyphagia,  No significant Mental Stressors.  A full 10 point Review of Systems was done, except as stated above, all other Review of Systems were negative  Objective:   Physical Exam  BP 146/96 - rechecked at 130/90  Pulse 88  Temp 98.2 F    Resp 16  Ht 5' 8.75"   Wt 207 lb 12.8 oz   BMI 30.92 kg/m2  HEENT - Eac's patent. TM's Nl.EOM's full. PERRLA. NasoOroPharynx clear. Neck - supple. Nl Thyroid. No bruits nodes JVD Chest - Clear equal BS Cor - Nl HS. RRR w/o sig MGR. PP 1(+) No edema. Abd - No palpable organomegaly, masses or tenderness. BS nl. MS- FROM. w/o deformities. Muscle power tone and bulk Nl. Gait Nl. Neuro - No obvious Cr N abnormalities. Sensory, motor and Cerebellar functions appear Nl w/o focal abnormalities.  Assessment & Plan:   1. Hypertension - Recc restarting his Atenolol 50 mg at 1/2 = 25 mg qam and his Lisinopril 20 mg at 1/2 = 10 mg qpm and monitoring BP's bid.  2. Mixed hyperlipidemia - Lipid panel  3. PreDiabetes  4. Encounter for long-term (current) use of other medications - CBC with Differential  5. Chronic Anxiety - Recc trial on low dose Citalopram 20 mg qd  ROV 1 mo to reassess

## 2013-12-08 NOTE — Progress Notes (Deleted)
Patient ID: Eric Bentley, male   DOB: 11/21/1975, 38 y.o.   MRN: [id number]

## 2013-12-08 NOTE — Patient Instructions (Addendum)
Recommend Take your Atenolol 50 mg 1/@ tablet (=25 mg) each morning  And   Lisinopril 20 mg 1/2 tablet (=10 mg) at nite      Hypertension As your heart beats, it forces blood through your arteries. This force is your blood pressure. If the pressure is too high, it is called hypertension (HTN) or high blood pressure. HTN is dangerous because you may have it and not know it. High blood pressure may mean that your heart has to work harder to pump blood. Your arteries may be narrow or stiff. The extra work puts you at risk for heart disease, stroke, and other problems.  Blood pressure consists of two numbers, a higher number over a lower, 110/72, for example. It is stated as "110 over 72." The ideal is below 120 for the top number (systolic) and under 80 for the bottom (diastolic). Write down your blood pressure today. You should pay close attention to your blood pressure if you have certain conditions such as:  Heart failure.  Prior heart attack.  Diabetes  Chronic kidney disease.  Prior stroke.  Multiple risk factors for heart disease. To see if you have HTN, your blood pressure should be measured while you are seated with your arm held at the level of the heart. It should be measured at least twice. A one-time elevated blood pressure reading (especially in the Emergency Department) does not mean that you need treatment. There may be conditions in which the blood pressure is different between your right and left arms. It is important to see your caregiver soon for a recheck. Most people have essential hypertension which means that there is not a specific cause. This type of high blood pressure may be lowered by changing lifestyle factors such as:  Stress.  Smoking.  Lack of exercise.  Excessive weight.  Drug/tobacco/alcohol use.  Eating less salt. Most people do not have symptoms from high blood pressure until it has caused damage to the body. Effective treatment can  often prevent, delay or reduce that damage. TREATMENT  When a cause has been identified, treatment for high blood pressure is directed at the cause. There are a large number of medications to treat HTN. These fall into several categories, and your caregiver will help you select the medicines that are best for you. Medications may have side effects. You should review side effects with your caregiver. If your blood pressure stays high after you have made lifestyle changes or started on medicines,   Your medication(s) may need to be changed.  Other problems may need to be addressed.  Be certain you understand your prescriptions, and know how and when to take your medicine.  Be sure to follow up with your caregiver within the time frame advised (usually within two weeks) to have your blood pressure rechecked and to review your medications.  If you are taking more than one medicine to lower your blood pressure, make sure you know how and at what times they should be taken. Taking two medicines at the same time can result in blood pressure that is too low. SEEK IMMEDIATE MEDICAL CARE IF:  You develop a severe headache, blurred or changing vision, or confusion.  You have unusual weakness or numbness, or a faint feeling.  You have severe chest or abdominal pain, vomiting, or breathing problems. MAKE SURE YOU:   Understand these instructions.  Will watch your condition.  Will get help right away if you are not doing well or  get worse. Document Released: 08/14/2005 Document Revised: 11/06/2011 Document Reviewed: 04/03/2008 [facility] DivisionExitCare Patient Information 2014 [place], [place].   Hypercholesterolemia High Blood Cholesterol Cholesterol is a white, waxy, fat-like protein needed by your body in small amounts. The liver makes all the cholesterol you need. It is carried from the liver by the blood through the blood vessels. Deposits (plaque) may build up on blood vessel walls. This makes the arteries  narrower and stiffer. Plaque increases the risk for heart attack and stroke. You cannot feel your cholesterol level even if it is very high. The only way to know is by a blood test to check your lipid (fats) levels. Once you know your cholesterol levels, you should keep a record of the test results. Work with your caregiver to to keep your levels in the desired range. WHAT THE RESULTS MEAN:  Total cholesterol is a rough measure of all the cholesterol in your blood.  LDL is the so-called bad cholesterol. This is the type that deposits cholesterol in the walls of the arteries. You want this level to be low.  HDL is the good cholesterol because it cleans the arteries and carries the LDL away. You want this level to be high.  Triglycerides are fat that the body can either burn for energy or store. High levels are closely linked to heart disease. DESIRED LEVELS:  Total cholesterol below 200.  LDL below 100 for people at risk, below 70 for very high risk.  HDL above 50 is good, above 60 is best.  Triglycerides below 150. HOW TO LOWER YOUR CHOLESTEROL:  Diet.  Choose fish or white meat chicken and [place], roasted or baked. Limit fatty cuts of red meat, fried foods, and processed meats, such as sausage and lunch meat.  Eat lots of fresh fruits and vegetables. Choose whole grains, beans, pasta, potatoes and cereals.  Use only small amounts of olive, corn or canola oils. Avoid butter, mayonnaise, shortening or palm kernel oils. Avoid foods with trans-fats.  Use skim/nonfat milk and low-fat/nonfat yogurt and cheeses. Avoid whole milk, cream, ice cream, egg yolks and cheeses. Healthy desserts include angel food cake, gingersnaps, animal crackers, hard candy, popsicles, and low-fat/nonfat frozen yogurt. Avoid pastries, cakes, pies and cookies.  Exercise.  A regular program helps decrease LDL and raises HDL.  Helps with weight control.  Do things that increase your activity level like  gardening, walking, or taking the stairs.  Medication.  May be prescribed by your caregiver to help lowering cholesterol and the risk for heart disease.  You may need medicine even if your levels are normal if you have several risk factors. HOME CARE INSTRUCTIONS   Follow your diet and exercise programs as suggested by your caregiver.  Take medications as directed.  Have blood work done when your caregiver feels it is necessary. MAKE SURE YOU:   Understand these instructions.  Will watch your condition.  Will get help right away if you are not doing well or get worse. Document Released: 08/14/2005 Document Revised: 11/06/2011 Document Reviewed: 01/30/2007 [facility] CenterincExitCare Patient Information 2014 [place]ExitCare, [place].

## 2013-12-21 ENCOUNTER — Emergency Department (HOSPITAL_COMMUNITY)
Admission: EM | Admit: 2013-12-21 | Discharge: 2013-12-22 | Disposition: A | Discharge status: Other Acute Inpatient Hospital | Payer: BC Managed Care – PPO | Attending: Emergency Medicine | Admitting: Emergency Medicine

## 2013-12-21 ENCOUNTER — Encounter (HOSPITAL_COMMUNITY): Payer: Self-pay | Admitting: Emergency Medicine

## 2013-12-21 ENCOUNTER — Emergency Department (HOSPITAL_COMMUNITY): Payer: BC Managed Care – PPO

## 2013-12-21 DIAGNOSIS — F410 Panic disorder [episodic paroxysmal anxiety] without agoraphobia: Secondary | ICD-10-CM | POA: Insufficient documentation

## 2013-12-21 DIAGNOSIS — Z9114 Patient's other noncompliance with medication regimen: Secondary | ICD-10-CM

## 2013-12-21 DIAGNOSIS — R6883 Chills (without fever): Secondary | ICD-10-CM | POA: Insufficient documentation

## 2013-12-21 DIAGNOSIS — Z91199 Patient's noncompliance with other medical treatment and regimen due to unspecified reason: Secondary | ICD-10-CM | POA: Insufficient documentation

## 2013-12-21 DIAGNOSIS — R072 Precordial pain: Secondary | ICD-10-CM | POA: Insufficient documentation

## 2013-12-21 DIAGNOSIS — R45851 Suicidal ideations: Secondary | ICD-10-CM

## 2013-12-21 DIAGNOSIS — F17200 Nicotine dependence, unspecified, uncomplicated: Secondary | ICD-10-CM | POA: Insufficient documentation

## 2013-12-21 DIAGNOSIS — R000 Tachycardia, unspecified: Secondary | ICD-10-CM | POA: Insufficient documentation

## 2013-12-21 DIAGNOSIS — E782 Mixed hyperlipidemia: Secondary | ICD-10-CM | POA: Insufficient documentation

## 2013-12-21 DIAGNOSIS — F3162 Bipolar disorder, current episode mixed, moderate: Secondary | ICD-10-CM | POA: Insufficient documentation

## 2013-12-21 DIAGNOSIS — Z9119 Patient's noncompliance with other medical treatment and regimen: Secondary | ICD-10-CM | POA: Insufficient documentation

## 2013-12-21 DIAGNOSIS — R079 Chest pain, unspecified: Secondary | ICD-10-CM

## 2013-12-21 DIAGNOSIS — F22 Delusional disorders: Secondary | ICD-10-CM | POA: Insufficient documentation

## 2013-12-21 DIAGNOSIS — Z760 Encounter for issue of repeat prescription: Secondary | ICD-10-CM | POA: Insufficient documentation

## 2013-12-21 DIAGNOSIS — R112 Nausea with vomiting, unspecified: Secondary | ICD-10-CM | POA: Insufficient documentation

## 2013-12-21 DIAGNOSIS — Z79899 Other long term (current) drug therapy: Secondary | ICD-10-CM | POA: Insufficient documentation

## 2013-12-21 DIAGNOSIS — I10 Essential (primary) hypertension: Secondary | ICD-10-CM | POA: Insufficient documentation

## 2013-12-21 DIAGNOSIS — E785 Hyperlipidemia, unspecified: Secondary | ICD-10-CM | POA: Insufficient documentation

## 2013-12-21 LAB — RAPID URINE DRUG SCREEN, HOSP PERFORMED
Amphetamines: NOT DETECTED
Barbiturates: NOT DETECTED
Benzodiazepines: NOT DETECTED
Cocaine: NOT DETECTED
OPIATES: NOT DETECTED
TETRAHYDROCANNABINOL: NOT DETECTED

## 2013-12-21 LAB — CBC
HEMATOCRIT: 45.8 % (ref 39.0–52.0)
Hemoglobin: 16.7 g/dL (ref 13.0–17.0)
MCH: 33.7 pg (ref 26.0–34.0)
MCHC: 36.5 g/dL — AB (ref 30.0–36.0)
MCV: 92.5 fL (ref 78.0–100.0)
Platelets: 270 10*3/uL (ref 150–400)
RBC: 4.95 MIL/uL (ref 4.22–5.81)
RDW: 11.5 % (ref 11.5–15.5)
WBC: 12.6 10*3/uL — ABNORMAL HIGH (ref 4.0–10.5)

## 2013-12-21 LAB — ACETAMINOPHEN LEVEL: Acetaminophen (Tylenol), Serum: <15 ug/mL (ref 10–30)

## 2013-12-21 LAB — I-STAT TROPONIN, ED
Troponin i, poc: 0.01 ng/mL (ref 0.00–0.08)
Troponin i, poc: 0 ng/mL (ref 0.00–0.08)

## 2013-12-21 LAB — BASIC METABOLIC PANEL
BUN: 19 mg/dL (ref 6–23)
CO2: 20 meq/L (ref 19–32)
Calcium: 10.1 mg/dL (ref 8.4–10.5)
Chloride: 96 mEq/L (ref 96–112)
Creatinine, Ser: 1.03 mg/dL (ref 0.50–1.35)
GFR calc Af Amer: >90 mL/min (ref >90)
GFR, EST NON AFRICAN AMERICAN: 90 mL/min — AB (ref >90)
GLUCOSE: 103 mg/dL — AB (ref 70–99)
POTASSIUM: 3.6 meq/L — AB (ref 3.7–5.3)
Sodium: 135 mEq/L — ABNORMAL LOW (ref 137–147)

## 2013-12-21 LAB — SALICYLATE LEVEL: Salicylate Lvl: <2 mg/dL — ABNORMAL LOW (ref 2.8–20.0)

## 2013-12-21 LAB — ETHANOL

## 2013-12-21 MED ORDER — IBUPROFEN 400 MG PO TABS: 600.0000 mg | ORAL_TABLET | Freq: Three times a day (TID) | ORAL | Status: DC | PRN

## 2013-12-21 MED ORDER — LORAZEPAM 1 MG PO TABS
1.0000 mg | ORAL_TABLET | Freq: Three times a day (TID) | ORAL | Status: DC | PRN
  Administered 2013-12-21 – 2013-12-22 (×2): 1 mg via ORAL
  Filled 2013-12-21 (×2): qty 1

## 2013-12-21 MED ORDER — CITALOPRAM HYDROBROMIDE 10 MG PO TABS
20.0000 mg | ORAL_TABLET | Freq: Every day | ORAL | Status: DC
  Administered 2013-12-21 – 2013-12-22 (×2): 20 mg via ORAL
  Filled 2013-12-21 (×2): qty 2

## 2013-12-21 MED ORDER — NICOTINE 21 MG/24HR TD PT24: 21.0000 mg | MEDICATED_PATCH | Freq: Every day | TRANSDERMAL | Status: DC

## 2013-12-21 MED ORDER — ZOLPIDEM TARTRATE 5 MG PO TABS: 5.0000 mg | ORAL_TABLET | Freq: Every evening | ORAL | Status: DC | PRN

## 2013-12-21 MED ORDER — ASPIRIN 81 MG PO CHEW
324.0000 mg | CHEWABLE_TABLET | Freq: Once | ORAL | Status: AC
  Administered 2013-12-21: 324 mg via ORAL
  Filled 2013-12-21: qty 4

## 2013-12-21 MED ORDER — NITROGLYCERIN 0.4 MG SL SUBL
0.4000 mg | SUBLINGUAL_TABLET | SUBLINGUAL | Status: DC | PRN
  Administered 2013-12-21: 0.4 mg via SUBLINGUAL

## 2013-12-21 MED ORDER — ACETAMINOPHEN 325 MG PO TABS: 650.0000 mg | ORAL_TABLET | ORAL | Status: DC | PRN

## 2013-12-21 MED ORDER — LORAZEPAM 2 MG/ML IJ SOLN
1.0000 mg | Freq: Once | INTRAMUSCULAR | Status: AC
  Administered 2013-12-21: 1 mg via INTRAVENOUS
  Filled 2013-12-21: qty 1

## 2013-12-21 MED ORDER — ALUM & MAG HYDROXIDE-SIMETH 200-200-20 MG/5ML PO SUSP: 30.0000 mL | ORAL | Status: DC | PRN

## 2013-12-21 MED ORDER — ONDANSETRON HCL 4 MG PO TABS: 4.0000 mg | ORAL_TABLET | Freq: Three times a day (TID) | ORAL | Status: DC | PRN

## 2013-12-21 MED ORDER — LISINOPRIL 20 MG PO TABS
20.0000 mg | ORAL_TABLET | Freq: Every day | ORAL | Status: DC
  Administered 2013-12-21 – 2013-12-22 (×2): 20 mg via ORAL
  Filled 2013-12-21 (×2): qty 1

## 2013-12-21 NOTE — BH Assessment (Signed)
Tele Assessment Note   Eric Bentley is an 38 y.o. male, single, Caucasian who presents unaccompanied [facility] initially reporting chest pain. Pt states he has been having panic attacks for the past few days. He reports that he hears people yelling at him and telling him he is worthless. He denies these voices are command in nature. He says he has had auditory hallucinations in the past and "they put me on Risperdal and the voices went away but I started growing woman's breasts." He describes feeling alternately depressed then angry. He says he has tearfulness and feel sad, hopeless and suicidal then feel very angry and agitated. He reports getting very little sleep for the past week. He states he has an appetite but he has vomited "because my nerves are so bad." Pt says he feels worthless. Pt also reports that he thinks he is having blackouts and when asked for more detail says that "I'll be sitting on the couch and then realize that time has passed and I don't remember what I was doing." He currently reports suicidal thoughts with no plan or intent. He reports one previous suicide attempt years ago where "I cut my stomach up with a knife" and says he [place]. He reports episodes of feeling like he wants to hurt people but denies current homicidal thoughts. He states he was incarcerated for almost one year in 2000 for assault. Pt denies any recent physical altercations with anyone. Pt reports he drinks beer regularly but not on a daily basis. He states he occasionally smokes marijuana and denies any other substance use.  Pt reports he lives alone. He states he is single, both his parents died in 2000, he has no children and he says he has no support. He says he believes both his parents may have had mental health problems. He is employed for a [employer] but his employer "is always running his mouth, running his mouth, saying I'm worthless." Pt states his  primary care physician, Dr. Harlen LabsMcCowan, put him on medication for anxiety two weeks ago (Pt cannot remember the name) but he feels it is not working.   Pt is dressed in a hospital gown, alert, oriented x4 with normal speech and normal motor behavior. Eye contact is good. Pt's mood is depressed and anxious and affect is anxious and labile. Thought process is coherent and relevant. Pt was cooperative and polite throughout assessment. Pt agrees that he needs inpatient psychiatric treatment.     Axis I: Psychotic Disorder NOS; Panic Disorder Axis II: Deferred Axis III:  Past Medical History  Diagnosis Date  . Hyperlipidemia   . Hypertension   . Depression   . Anxiety   . Unspecified essential hypertension 10/14/2013  . Mixed hyperlipidemia 10/14/2013  . Bipolar 1 disorder, mixed, moderate 10/14/2013   Axis IV: other psychosocial or environmental problems and problems with primary support group Axis V: GAF=25  Past Medical History:  Past Medical History  Diagnosis Date  . Hyperlipidemia   . Hypertension   . Depression   . Anxiety   . Unspecified essential hypertension 10/14/2013  . Mixed hyperlipidemia 10/14/2013  . Bipolar 1 disorder, mixed, moderate 10/14/2013    History reviewed. No pertinent past surgical history.  Family History:  Family History  Problem Relation Age of Onset  . Hyperlipidemia Mother   . Hypertension Mother   . Heart disease Mother 642    fatal  . Early death Mother   . Cancer Sister  breast  . Depression Sister   . Heart disease Brother   . Hyperlipidemia Brother   . Hypertension Brother   . Stroke Brother 28    OBESE 400#  . Heart disease Paternal Grandfather   . Hyperlipidemia Paternal Grandfather   . Hypertension Paternal Grandfather     Social History:  reports that he has been smoking.  He quit smokeless tobacco use about 2 months ago. He reports that he drinks alcohol. He reports that he does not use illicit drugs.  Additional Social History:   Alcohol / Drug Use Pain Medications: Denies abuse Prescriptions: Denies abuse Over the Counter: Denies abuse History of alcohol / drug use?: Yes Longest period of sobriety (when/how long): Unknown Negative Consequences of Use:  (Pt denies) Substance #1 Name of Substance 1: Alcohol 1 - Age of First Use: adolescent 1 - Amount (size/oz): 3-4 beers 1 - Frequency: 3-4 times per week 1 - Duration: ongoing  1 - Last Use / Amount: 12/19/13 Substance #2 Name of Substance 2: Marijuana 2 - Age of First Use: adolescent 2 - Amount (size/oz): 1 joint 2 - Frequency: 1-2 times per month 2 - Duration: ongoing 2 - Last Use / Amount: unknown  CIWA: CIWA-Ar BP: 154/108 mmHg Pulse Rate: 109 COWS:    Allergies: No Known Allergies  Home Medications:  (Not in a hospital admission)  OB/GYN Status:  No LMP for male patient.  General Assessment Data [facility] Is this a Tele or Face-to-Face Assessment?: Tele Assessment Is this an Initial Assessment or a Re-assessment for this encounter?: Initial Assessment Living Arrangements: Alone Can pt return to current living arrangement?: Yes Admission Status: Voluntary Is patient capable of signing voluntary admission?: Yes Transfer from: Acute Hospital Referral Source: Self/Family/Friend     [facility] Crisis [facility] Living Arrangements: Alone Name of Psychiatrist: None Name of Therapist: None  Education Status Is patient currently in school?: No Current Grade: NA Highest grade of school patient has completed: NA Name of school: NA Contact person: NA  Risk to self Suicidal Ideation: Yes-Currently Present Suicidal Intent: No Is patient at risk for suicide?: Yes Suicidal Plan?: No Access to Means: No What has been your use of drugs/alcohol within the last 12 months?: Pt reports regular alcohol use and occasional marijuana use Previous Attempts/Gestures: Yes How many times?: 1 Other Self Harm Risks: Pt reports hearing  voices Triggers for Past Attempts: Hallucinations Intentional Self Injurious Behavior: None Family Suicide History: No;See progress notes Recent stressful life event(s): Conflict (Comment) (Conlficts at work) Persecutory voices/beliefs?: Yes Depression: Yes Depression Symptoms: Despondent;Insomnia;Tearfulness;Isolating;Fatigue;Guilt;Loss of interest in usual pleasures;Feeling worthless/self pity;Feeling angry/irritable Substance abuse history and/or treatment for substance abuse?: No Suicide prevention information given to non-admitted patients: Not applicable  Risk to Others Homicidal Ideation: No Thoughts of Harm to Others: Yes-Currently Present Comment - Thoughts of Harm to Others: Feels agitated and generally like hurting someone Current Homicidal Intent: No Current Homicidal Plan: No Access to Homicidal Means: No Identified Victim: No particular person History of harm to others?: Yes Assessment of Violence: In distant past Violent Behavior Description: Pt was incarcerated for assault in the past Does patient have access to weapons?: No Criminal Charges Pending?: No Does patient have a court date: No  Psychosis Hallucinations: Auditory (Hearing voices berating him and yelling at him) Delusions: Persecutory (Pt reports feeling paranoid)  Mental Status Report Appear/Hygiene: Other (Comment) [facility](Hospital gown) Eye Contact: Good Motor Activity: Agitation Speech: Logical/coherent Level of Consciousness: Alert Mood: Depressed;Anxious Affect: Anxious;Labile  Anxiety Level: Panic Attacks Panic attack frequency: daily Most recent panic attack: today Thought Processes: Coherent;Relevant Judgement: Unimpaired Orientation: Person;Place;Time;Situation Obsessive Compulsive Thoughts/Behaviors: None  Cognitive Functioning Concentration: Decreased Memory: Recent Intact;Remote Intact IQ: Average Insight: Fair Impulse Control: Fair Appetite: Fair Weight Loss: 0 Weight Gain:  0 Sleep: Decreased Total Hours of Sleep: 2 Vegetative Symptoms: None  ADLScreening [facility] Assessment Services) Patient's cognitive ability adequate to safely complete daily activities?: Yes Patient able to express need for assistance with ADLs?: Yes Independently performs ADLs?: Yes (appropriate for developmental age)  Prior Inpatient Therapy Prior Inpatient Therapy: Yes Prior Therapy Dates: "A long time ago" Prior Therapy Facilty/Provider(s): Hospital in [place], [place] Reason for Treatment: Suicide attempt  Prior Outpatient Therapy Prior Outpatient Therapy: No Prior Therapy Dates: NA Prior Therapy Facilty/Provider(s): NA Reason for Treatment: NA  ADL Screening (condition at time of admission) Patient's cognitive ability adequate to safely complete daily activities?: Yes Is the patient deaf or have difficulty hearing?: No Does the patient have difficulty seeing, even when wearing glasses/contacts?: No Does the patient have difficulty concentrating, remembering, or making decisions?: No Patient able to express need for assistance with ADLs?: Yes Does the patient have difficulty dressing or bathing?: No Independently performs ADLs?: Yes (appropriate for developmental age) Does the patient have difficulty walking or climbing stairs?: No Weakness of Legs: None Weakness of Arms/Hands: None  Home Assistive Devices/Equipment Home Assistive Devices/Equipment: None    Abuse/Neglect Assessment (Assessment to be complete while patient is alone) Physical Abuse: Denies Verbal Abuse: Denies Sexual Abuse: Denies Exploitation of patient/patient's resources: Denies Self-Neglect: Denies     Merchant navy officer (For Healthcare) Advance Directive: Patient does not have advance directive;Patient would not like information Pre-existing out of facility DNR order (yellow form or pink MOST form): No Nutrition Screen- [facility] Adult/[facility]/AP Patient's home diet: Regular  Additional Information 1:1 In  Past 12 Months?: No CIRT Risk: No Elopement Risk: No Does patient have medical clearance?: Yes     Disposition: Binnie Rail, AC at [facility] confirmed adult unit is at capacity. Gave clinical report to Alberteen Sam, NP who agrees Pt meets criteria for inpatient psychiatric treatment. TTS will contact other facilities for placement. Notified Fayrene Helper, PA-C of recommendation.  Disposition Initial Assessment Completed for this Encounter: Yes Disposition of Patient: Other dispositions Other disposition(s): Other (Comment) ([facility] at capacity. Contact other facilities for placement.)  Harlin Rain Patsy [place], [facility], [facility] Triage Specialist [phone]   Harlin Rain Patsy [place]. 12/21/2013 10:58 PM

## 2013-12-21 NOTE — BH Assessment (Signed)
Received call for assessment. Spoke to Fayrene HelperBowie Tran, PA-C who says Pt has a history of bipolar disorder and presents with chest pain. Pt says he is suicidal. Tele-assessment will be initiated.  Harlin RainFord Ellis Ria CommentWarrick Jr, LPC, [facility] Triage Specialist [phone]

## 2013-12-21 NOTE — ED Notes (Addendum)
Pt c/o center chest pain onset yesterday. Pt reports being depressed and under a lot of stress. Pt has not taken any medications for symptoms today. Pt has not been taking his medications for Bipolar in a long time. Reports hearing voices of yelling and being told he is worthless. Pt reports intermittent thoughts of suicidal ideation.

## 2013-12-21 NOTE — BH Assessment (Signed)
Assessment complete. Binnie RailJoann Glover, [facility] confirmed adult unit is at capacity. Gave clinical report to Alberteen SamFran Hobson, NP who agrees Pt meets criteria for inpatient psychiatric treatment. TTS will contact other facilities for placement. Notified Fayrene HelperBowie Tran, PA-C of recommendation.  Harlin RainFord Ellis Ria CommentWarrick Jr, LPC, [facility] Triage Specialist [phone]

## 2013-12-21 NOTE — ED Provider Notes (Signed)
CSN: [id number]     Arrival date & time 12/21/13  1742 History   First MD Initiated Contact with Patient 12/21/13 1836     Chief Complaint  Patient presents with  . Chest Pain  . Suicidal  . Medication Refill     (Consider location/radiation/quality/duration/timing/severity/associated sxs/prior Treatment) HPI  38 year old male with history of chronic anxiety, bipolar, depression, hypertension hyperlipidemia who presents for chest pain.  Patient presents complaining of gradual onset of midsternal chest discomfort which he described as a stabbing sensation, nonradiating with associated nausea, vomiting once, diaphoresis, having lightheadedness and dizziness, and shortness of breath. This has been ongoing for the past 3 hours and is improving without any specific treatment. He also endorsed having sensation of paranoia and panic attack since yesterday. He feels as if somebody is out to get him. He has panic attack but this in the past but not with chest pain, this is new for him. He works in [employer] but denies having exertional chest pain. He also endorsed chills without fever. Endorse having heart palpitation. Also reports having intermittent suicidal ideation without plan. Denies HI or hallucination. Denies using street drugs but admits to drinking alcohol, last drink was 2 days ago. Patient is a former smoker and quit 2 months ago. Denies any prior history of MI. Denies any prior history of PE DVT, no recent surgery, prolonged bed rest, unilateral leg swelling or calf pain, or history of cancer. He is here in his own volition requesting for help with his chest pain.  Past Medical History  Diagnosis Date  . Hyperlipidemia   . Hypertension   . Depression   . Anxiety   . Unspecified essential hypertension 10/14/2013  . Mixed hyperlipidemia 10/14/2013  . Bipolar 1 disorder, mixed, moderate 10/14/2013   History reviewed. No pertinent past surgical history. Family History  Problem Relation  Age of Onset  . Hyperlipidemia Mother   . Hypertension Mother   . Heart disease Mother 7942    fatal  . Early death Mother   . Cancer Sister     breast  . Depression Sister   . Heart disease Brother   . Hyperlipidemia Brother   . Hypertension Brother   . Stroke Brother 28    OBESE 400#  . Heart disease Paternal Grandfather   . Hyperlipidemia Paternal Grandfather   . Hypertension Paternal Grandfather    History  Substance Use Topics  . Smoking status: Current Some Day Smoker -- 0.50 packs/day for 15 years  . Smokeless tobacco: Former NeurosurgeonUser    Quit date: 10/17/2013  . Alcohol Use: Yes     Comment: occasional    Review of Systems  All other systems reviewed and are negative.     Allergies  Review of patient's allergies indicates no known allergies.  Home Medications   Prior to Admission medications   Medication Sig Start Date End Date Taking? Authorizing Provider  Cholecalciferol (VITAMIN D3) 2000 UNITS capsule Take 1 capsule (2,000 Units total) by mouth daily. 12/08/13  Yes Lucky CowboyWilliam McKeown, MD  citalopram (CELEXA) 20 MG tablet Take 1 tablet (20 mg total) by mouth daily. 12/08/13 12/08/14 Yes Lucky CowboyWilliam McKeown, MD  diazepam (VALIUM) 2 MG tablet Take 1 tablet (2 mg total) by mouth every 12 (twelve) hours as needed for anxiety. 10/13/13 10/13/14 Yes Melissa R Smith, PA-C  Garlic (GARLIQUE) 400 MG TBEC Take 1 tablet (400 mg total) by mouth daily. 12/08/13  Yes Lucky CowboyWilliam McKeown, MD  Krill Oil 300 MG CAPS Take 1 capsule (  300 mg total) by mouth daily. 12/08/13  Yes Lucky Cowboy, MD  lisinopril (PRINIVIL,ZESTRIL) 20 MG tablet Take 20 mg by mouth daily.   Yes Historical Provider, MD  Multiple Vitamins-Minerals (MULTIVITAMIN WITH MINERALS) tablet Take 1 tablet by mouth daily. 12/08/13 12/08/14 Yes Lucky Cowboy, MD  Omega-3 Fatty Acids (FISH OIL) 1200 MG CAPS Take 0.8333 capsules (1,000 mg total) by mouth daily. 12/08/13  Yes Lucky Cowboy, MD   BP 146/111  Pulse 135  Temp(Src) 98.8 F  (37.1 C) (Oral)  Resp 20  Ht 5' 10.5" (1.791 m)  Wt 207 lb (93.895 kg)  BMI 29.27 kg/m2  SpO2 97% Physical Exam  Constitutional: He is oriented to person, place, and time. He appears well-developed and well-nourished. He appears distressed (patient appeared anxious).  HENT:  Head: Atraumatic.  Mouth/Throat: Oropharynx is clear and moist.  Eyes: Conjunctivae and EOM are normal. Pupils are equal, round, and reactive to light.  Neck: Normal range of motion. Neck supple.  Cardiovascular:  Tachycardia without murmurs, rubs, or gallops noted  Pulmonary/Chest: Effort normal and breath sounds normal. No respiratory distress. He has no wheezes. He has no rales. He exhibits no tenderness.  Abdominal: Soft. There is no tenderness.  Musculoskeletal: He exhibits no edema.  Neurological: He is alert and oriented to person, place, and time.  Skin: No rash noted.  Psychiatric: His speech is normal and behavior is normal. Judgment normal. His mood appears anxious. Thought content is paranoid. Thought content is not delusional. He expresses no homicidal and no suicidal ideation.    ED Course  Procedures (including critical care time)\  6:54 PM This patient has history of bipolar and history of chronic anxiety. He is here with chest pain that is atypical for ACS but will work up given pt's risk factors. He is also very anxious and is tachycardic but score low on the Wells criteria for PE.  EKG demonstrates sinus tach.  7:33 PM Care discussed with Dr. Lynelle Doctor. Plan for delta troponin and then psych eval.  Ativan given for anxiety.  ASA and SL nitro given for cp.    10:02 PM Negative delta troponin.  Pain is mostly resolved. Anxiety improves with ativan.  Pt is medically cleared, but will have TTS to evaluate pt for his SI, medication noncompliant, having auditory hallucination      Labs Review Labs Reviewed  CBC - Abnormal; Notable for the following:    WBC 12.6 (*)    MCHC 36.5 (*)    All other  components within normal limits  BASIC METABOLIC PANEL - Abnormal; Notable for the following:    Sodium 135 (*)    Potassium 3.6 (*)    Glucose, Bld 103 (*)    GFR calc non Af Amer 90 (*)    All other components within normal limits  SALICYLATE LEVEL - Abnormal; Notable for the following:    Salicylate Lvl <2.0 (*)    All other components within normal limits  ACETAMINOPHEN LEVEL  ETHANOL  URINE RAPID DRUG SCREEN (HOSP PERFORMED)  I-STAT TROPOININ, ED  Rosezena Sensor, ED    Imaging Review Dg Chest Port 1 View  12/21/2013   CLINICAL DATA:  Chest pain, chest pressure and short of breath.  EXAM: PORTABLE CHEST - 1 VIEW  COMPARISON:  None.  FINDINGS: Cardiopericardial silhouette within normal limits. Mediastinal contours normal. Trachea midline. No airspace disease or effusion. Monitoring leads project over the chest. Apical lordotic projection.  IMPRESSION: No active disease.   Electronically Signed  By: Andreas NewportGeoffrey  Lamke M.D.   On: 12/21/2013 19:21     EKG Interpretation None      Date: 12/21/2013  Rate: 137  Rhythm: sinus tachycardia  QRS Axis: normal  Intervals: normal  ST/T Wave abnormalities: nonspecific ST/T changes  Conduction Disutrbances:none  Narrative Interpretation:   Old EKG Reviewed: none available    MDM   Final diagnoses:  Chest pain  Suicidal ideation  Noncompliance with medications    BP 154/108  Pulse 109  Temp(Src) 98.8 F (37.1 C) (Oral)  Resp 20  Ht 5' 10.5" (1.791 m)  Wt 207 lb (93.895 kg)  BMI 29.27 kg/m2  SpO2 99%  I have reviewed nursing notes and vital signs. I personally reviewed the imaging tests through PACS system  I reviewed available ER/hospitalization records thought the EMR    Fayrene HelperBowie Dequandre Cordova, [place] JerseyPA-C 12/21/13 2207

## 2013-12-21 NOTE — ED Provider Notes (Signed)
Medical screening examination/treatment/procedure(s) were performed by non-physician practitioner and as supervising physician I was immediately available for consultation/collaboration.   EKG Interpretation   Date/Time:  Sunday December 21 2013 17:46:28 EDT Ventricular Rate:  137 PR Interval:  132 QRS Duration: 82 QT Interval:  282 QTC Calculation: 425 R Axis:   56 Text Interpretation:  Sinus tachycardia Nonspecific ST abnormality No old  tracing to compare Confirmed by Luc Shammas  MD-I, Nicklas Mcsweeney ([id number]) on 12/21/2013  7:31:50 PM      Devoria AlbeIva Arcadio Cope, MD, Franz DellFACEP   [name] L Syan Cullimore, MD 12/21/13 2213

## 2013-12-21 NOTE — ED Notes (Signed)
[facility] monitor be placed in the room.  Monitor in room at this time Trained sitter remains at bedside.

## 2013-12-22 NOTE — BH Assessment (Signed)
12/22/13  The following referrals has been faxed to the following facilities:  [facility]- Per Brian [facility]- Per Peggy [facility]- Per Jennifer [facility]- Per Lisa [facility]- Per Eric The following Facilities was at Capacity: [facility]- Per Patsy [facility]- Per [facility]- Per Debbie [facility]- Per Nicole [facility]- Per Dan [facility]- Per Anne  Lakyn Mantione Disposition MHT/NS   

## 2013-12-22 NOTE — ED Notes (Signed)
Pt belongs and personal valuables retrieved from security and sent with the patient via [employer] service.

## 2013-12-22 NOTE — ED Notes (Signed)
PT HEARD VERBALIZING "I NEED TO GET OUT OF HERE" "THIS PLACE IS DRIVING ME CRAZY"

## 2013-12-22 NOTE — ED Notes (Signed)
Pt as nurses station using phone.  Pt calling boss and friend Jena GaussHugh who called regarding pt this am.  Pt is calm and cooperative with staff.  Sitter with pt.

## 2013-12-22 NOTE — BH Assessment (Signed)
12/22/13 Per Darl PikesSusan [facility] by [facility] Rosanne SackBronner. Pt must arrive anytime before 22:00 and RN to RN report is [phone]. RN Clydie BraunKaren @ [place] has been notified. Bernadene PersonSybrina Wei [place] Disposition MHT/NS

## 2014-01-07 ENCOUNTER — Encounter: Payer: Self-pay | Admitting: Internal Medicine

## 2014-01-07 ENCOUNTER — Ambulatory Visit (INDEPENDENT_AMBULATORY_CARE_PROVIDER_SITE_OTHER): Payer: BC Managed Care – PPO | Admitting: Internal Medicine

## 2014-01-07 VITALS — BP 96/62 | HR 84 | Temp 98.6°F | Resp 18 | Ht 68.25 in | Wt 200.0 lb

## 2014-01-07 DIAGNOSIS — F319 Bipolar disorder, unspecified: Secondary | ICD-10-CM

## 2014-01-07 DIAGNOSIS — I10 Essential (primary) hypertension: Secondary | ICD-10-CM

## 2014-01-07 MED ORDER — LAMOTRIGINE 25 MG PO TABS: 25.0000 mg | ORAL_TABLET | Freq: Two times a day (BID) | ORAL | Status: DC

## 2014-01-07 NOTE — Progress Notes (Signed)
Subjective:    Patient ID: Eric Bentley, male    DOB: 09/22/1975, 38 y.o.   MRN: [id number]  HPI VElliot Dallyery nice 38 yo WM w/ HTN and Hx/o Bipolar Disorder and chronic anxiety returns for 1 month F/U after tapering his BP meds because of orthostatic dizziness and also starting low dose citalopram for anxiety. In the interim.  [facility] 4/ 26/2015 [facility] with [facility], paranoia and auditory hallucinations and after cardiac w/u was neg and [facility] was full, [facility] and was started on Lamictal. After several day and being felt stable he was d/c'd for out-pt follow-up.    Medication List       This list is accurate as of: 01/07/14  9:24 PM.  Always use your most recent med list.               ATENOLOL PO  Take by mouth. Patient unsure of dose of Rx     citalopram 20 MG tablet  Commonly known as:  CELEXA  Take 1 tablet (20 mg total) by mouth daily.     Fish Oil 1200 MG Caps  Take 0.8333 capsules (1,000 mg total) by mouth daily.     Garlic 400 MG Tbec  Commonly known as:  GARLIQUE  Take 1 tablet (400 mg total) by mouth daily.     Krill Oil 300 MG Caps  Take 1 capsule (300 mg total) by mouth daily.     lamoTRIgine 25 MG tablet  Commonly known as:  LAMICTAL  Take 1 tablet (25 mg total) by mouth 2 (two) times daily.     lisinopril 20 MG tablet  Commonly known as:  PRINIVIL,ZESTRIL  Take 20 mg by mouth daily.     LORazepam 1 MG tablet  Commonly known as:  ATIVAN  Take 1 mg by mouth 2 (two) times daily.     multivitamin with minerals tablet  Take 1 tablet by mouth daily.     Vitamin D3 2000 UNITS capsule  Take 1 capsule (2,000 Units total) by mouth daily.       No Known Allergies  Past Medical History  Diagnosis Date  . Hyperlipidemia   . Hypertension   . Depression   . Anxiety   . Unspecified essential hypertension 10/14/2013  . Mixed hyperlipidemia 10/14/2013  . Bipolar 1 disorder, mixed, moderate 10/14/2013    Review of Systems  In addition to the HPI above,  No Fever-chills,  No Headache, No changes with Vision or hearing,  No problems swallowing food or Liquids,  No Chest pain or productive Cough or Shortness of Breath,  No Abdominal pain, No Nausea or Vomiting, Bowel movements are regular,  No Blood in stool or Urine,  No dysuria,  No new skin rashes or bruises,  No new joints pains-aches,  No new weakness, tingling, numbness in any extremity,  No recent weight loss,  No polyuria, polydypsia or polyphagia,  A full 10 point Review of Systems was done, except as stated above, all other Review of Systems were negative  Objective:   Physical Exam  BP 96/62  Pulse 84  Temp(Src) 98.6 F (37 C) (Temporal)  Resp 18  Ht 5' 8.25" (1.734 m)  Wt 200 lb (90.719 kg)  BMI 30.17 kg/m2  HEENT - Eac's patent. TM's Nl.EOM's full. PERRLA. NasoOroPharynx clear. Neck - supple. Nl Thyroid. No bruits nodes JVD Chest - Clear equal BS Cor - Nl HS. RRR w/o sig  MGR. PP 1(+) No edema. MS- FROM. w/o deformities. Muscle power tone and bulk Nl. Gait Nl. Neuro - No obvious Cr N abnormalities. Sensory, motor and Cerebellar functions appear Nl w/o focal abnormalities. Psych - A & O x 3 - not delusional or paranoid with normal reality testing.  Assessment & Plan:   1. Hypertension - advised take his atenolol x 1/2 qam and his lisinopril 20 mg x 1/2 qhs  2. Bipolar disorder, unspecified  - lamoTRIgine (LAMICTAL) 25 MG tablet; Take 1 tablet (25 mg total) by mouth 2 (two) times daily.  - ROV 3 weeks

## 2014-01-07 NOTE — Patient Instructions (Signed)
Bipolar Disorder °Bipolar disorder is a mental illness. The term bipolar disorder actually is used to describe a group of disorders that all share varying degrees of emotional highs and lows that can interfere with daily functioning, such as work, school, or relationships. Bipolar disorder also can lead to drug abuse, hospitalization, and suicide. °The emotional highs of bipolar disorder are periods of elation or irritability and high energy. These highs can range from a mild form (hypomania) to a severe form (mania). People experiencing episodes of hypomania may appear energetic, excitable, and highly productive. People experiencing mania may behave impulsively or erratically. They often make poor decisions. They may have difficulty sleeping. The most severe episodes of mania can involve having very distorted beliefs or perceptions about the world and seeing or hearing things that are not real (psychotic delusions and hallucinations).  °The emotional lows of bipolar disorder (depression) also can range from mild to severe. Severe episodes of bipolar depression can involve psychotic delusions and hallucinations. °Sometimes people with bipolar disorder experience a state of mixed mood. Symptoms of hypomania or mania and depression are both present during this mixed-mood episode. °SIGNS AND SYMPTOMS °There are signs and symptoms of the episodes of hypomania and mania as well as the episodes of depression. The signs and symptoms of hypomania and mania are similar but vary in severity. They include: °· Inflated self-esteem or feeling of increased self-confidence. °· Decreased need for sleep. °· Unusual talkativeness (rapid or pressured speech) or the feeling of a need to keep talking. °· Sensation of racing thoughts or constant talking, with quick shifts between topics that may or may not be related (flight of ideas). °· Decreased ability to focus or concentrate. °· Increased purposeful activity, such as work, studies,  or social activity, or nonproductive activity, such as pacing, squirming and fidgeting, or finger and toe tapping. °· Impulsive behavior and use of poor judgment, resulting in high-risk activities, such as having unprotected sex or spending excessive amounts of money. °Signs and symptoms of depression include the following:  °· Feelings of sadness, hopelessness, or helplessness. °· Frequent or uncontrollable episodes of crying. °· Lack of feeling anything or caring about anything. °· Difficulty sleeping or sleeping too much.  °· Inability to enjoy the things you used to enjoy.   °· Desire to be alone all the time.   °· Feelings of guilt or worthlessness.  °· Lack of energy or motivation.   °· Difficulty concentrating, remembering, or making decisions.  °· Change in appetite or weight beyond normal fluctuations. °· Thoughts of death or the desire to harm yourself. °DIAGNOSIS  °Bipolar disorder is diagnosed through an assessment by your caregiver. Your caregiver will ask questions about your emotional episodes. There are two main types of bipolar disorder. People with type I bipolar disorder have manic episodes with or without depressive episodes. People with type II bipolar disorder have hypomanic episodes and major depressive episodes, which are more serious than mild depression. The type of bipolar disorder you have can make an important difference in how your illness is monitored and treated. °Your caregiver may ask questions about your medical history and use of alcohol or drugs, including prescription medication. Certain medical conditions and substances also can cause emotional highs and lows that resemble bipolar disorder (secondary bipolar disorder).  °TREATMENT  °Bipolar disorder is a long-term illness. It is best controlled with continuous treatment rather than treatment only when symptoms occur. The following treatments can be prescribed for bipolar disorders: °· Medication Medication can be prescribed by  a doctor   that is an expert in treating mental disorders (psychiatrists). Medications called mood stabilizers are usually prescribed to help control the illness. Other medications are sometimes added if symptoms of mania, depression, or psychotic delusions and hallucinations occur despite the use of a mood stabilizer. °· Talk therapy Some forms of talk therapy are helpful in providing support, education, and guidance. °A combination of medication and talk therapy is best for managing the disorder over time. A procedure in which electricity is applied to your brain through your scalp (electroconvulsive therapy) is used in cases of severe mania when medication and talk therapy do not work or work too slowly. °Document Released: 11/20/2000 Document Revised: 12/09/2012 Document Reviewed: 09/09/2012 °ExitCare® Patient Information ©2014 [employer]. ° °

## 2014-01-30 ENCOUNTER — Encounter: Payer: Self-pay | Admitting: Internal Medicine

## 2014-01-30 NOTE — Progress Notes (Signed)
Patient ID: Eric Bentley, male   DOB: November 01, 1975, 38 y.o.   MRN: [id number]         R E S C H E D U L E

## 2014-03-03 ENCOUNTER — Ambulatory Visit: Payer: Self-pay | Admitting: Internal Medicine

## 2014-10-07 ENCOUNTER — Telehealth: Payer: Self-pay | Admitting: Internal Medicine

## 2014-10-07 NOTE — Telephone Encounter (Signed)
[facility]  Thank you, Katrina Webb [facility]vers[facility]Welch [facility] [phone] Fax [phone]

## 2014-10-14 ENCOUNTER — Encounter: Payer: Self-pay | Admitting: Emergency Medicine

## 2014-10-16 ENCOUNTER — Ambulatory Visit (INDEPENDENT_AMBULATORY_CARE_PROVIDER_SITE_OTHER): Payer: BLUE CROSS/BLUE SHIELD | Admitting: Internal Medicine

## 2014-10-16 ENCOUNTER — Encounter: Payer: Self-pay | Admitting: Internal Medicine

## 2014-10-16 VITALS — BP 128/82 | HR 108 | Temp 98.4°F | Resp 18 | Ht 68.25 in | Wt 214.0 lb

## 2014-10-16 DIAGNOSIS — M5442 Lumbago with sciatica, left side: Secondary | ICD-10-CM

## 2014-10-16 DIAGNOSIS — M5441 Lumbago with sciatica, right side: Secondary | ICD-10-CM

## 2014-10-16 MED ORDER — PREDNISONE 20 MG PO TABS: ORAL_TABLET | ORAL | Status: DC

## 2014-10-16 MED ORDER — TIZANIDINE HCL 4 MG PO TABS: 4.0000 mg | ORAL_TABLET | Freq: Three times a day (TID) | ORAL | Status: DC

## 2014-10-16 MED ORDER — HYDROCODONE-ACETAMINOPHEN 5-325 MG PO TABS: ORAL_TABLET | ORAL | Status: DC

## 2014-10-16 NOTE — Patient Instructions (Signed)
Back Pain, Adult °Back pain is very common. The pain often gets better over time. The cause of back pain is usually not dangerous. Most people can learn to manage their back pain on their own.  °HOME CARE  °· Stay active. Start with short walks on flat ground if you can. Try to walk farther each day. °· Do not sit, drive, or stand in one place for more than 30 minutes. Do not stay in bed. °· Do not avoid exercise or work. Activity can help your back heal faster. °· Be careful when you bend or lift an object. Bend at your knees, keep the object close to you, and do not twist. °· Sleep on a firm mattress. Lie on your side, and bend your knees. If you lie on your back, put a pillow under your knees. °· Only take medicines as told by your doctor. °· Put ice on the injured area. °¨ Put ice in a plastic bag. °¨ Place a towel between your skin and the bag. °¨ Leave the ice on for 15-20 minutes, 03-04 times a day for the first 2 to 3 days. After that, you can switch between ice and heat packs. °· Ask your doctor about back exercises or massage. °· Avoid feeling anxious or stressed. Find good ways to deal with stress, such as exercise. °GET HELP RIGHT AWAY IF:  °· Your pain does not go away with rest or medicine. °· Your pain does not go away in 1 week. °· You have new problems. °· You do not feel well. °· The pain spreads into your legs. °· You cannot control when you poop (bowel movement) or pee (urinate). °· Your arms or legs feel weak or lose feeling (numbness). °· You feel sick to your stomach (nauseous) or throw up (vomit). °· You have belly (abdominal) pain. °· You feel like you may pass out (faint). °MAKE SURE YOU:  °· Understand these instructions. °· Will watch your condition. °· Will get help right away if you are not doing well or get worse. °Document Released: 01/31/2008 Document Revised: 11/06/2011 Document Reviewed: 12/16/2013 °ExitCare® Patient Information ©2015 [employer]. This information is not intended  to replace advice given to you by your health care provider. Make sure you discuss any questions you have with your health care provider. ° °

## 2014-10-17 ENCOUNTER — Encounter: Payer: Self-pay | Admitting: Internal Medicine

## 2014-10-17 NOTE — Progress Notes (Signed)
   Subjective:    Patient ID: Elliot DallyJoseph Sobotta, male    DOB: 1976/01/05, 39 y.o.   MRN: [id number]  HPI   Very nice 39 yo WM presents with 1 day hx/o LBP w/ R. sciatica occurring after lifting heavy objects yesterday..  Medication Sig  . ATENOLOL PO Take by mouth. Patient unsure of dose of Rx  . VITAMIN D 2000 U  Take 1 capsule (2,000 Units total) by mouth daily.  . citalopram  20 MG tab Take 1 tablet (20 mg total) by mouth daily.  . Garlic (GARLIQUE) 400 MG  Take 1 tablet (400 mg total) by mouth daily.  Boris Lown. Krill Oil 300 MG CAPS Take 1 capsule (300 mg total) by mouth daily.  Marland Kitchen. lamoTRIgine (LAMICTAL) 25 MG ta Take 1 tablet (25 mg total) by mouth 2 (two) times daily.  Marland Kitchen. lisinopril  20 MG tab Take 20 mg by mouth daily.  Marland Kitchen. LORazepam  1 MG tablet Take 1 mg by mouth 2 (two) times daily.  . MULTIVITAMIN WITH MINERALS  Take 1 tablet by mouth daily.  Marland Kitchen. FISH OIL1200 MG  Take 0.8333 capsules (1,000 mg total) by mouth daily.   No Known Allergies  Review of Systems  Neg 10 pt systems review to above    Objective:   Physical Exam  BP 128/82   P 108  T 98.4 F   Resp 18  Ht 5' 8.25"   Wt 214 lb     BMI 32.28   Appears in moderate pain.  HEENT - Eac's patent. TM's Nl. EOM's full. PERRLA. NasoOroPharynx clear. Neck - supple. Nl Thyroid. Carotids 2+ & No bruits, nodes, JVD Chest - Clear equal BS w/o Rales, rhonchi, wheezes. Cor - Nl HS. RRR w/o sig MGR. PP 1(+). No edema. Abd - No palpable organomegaly, masses or tenderness. BS nl. MS- FROM w/o deformities. Muscle power, tone and bulk Nl. Gait Nl. (+)N SLR Left - tender para Lumbar spasm. Neuro - No obvious Cr N abnormalities. Sensory, motor and Cerebellar functions appear Nl w/o focal abnormalities. Psyche - Mental status normal & appropriate.     Assessment & Plan:   1. Left-sided low back pain with right-sided sciatica  - RX Prednisone pulse/taper, Zanaflex, Norco 5 prn - heat , rest - discussed body mechanics. Discussed consider Lumbar  next week if not significantly improve on steroids.

## 2014-11-13 ENCOUNTER — Encounter: Payer: Self-pay | Admitting: Internal Medicine

## 2014-11-13 ENCOUNTER — Ambulatory Visit (INDEPENDENT_AMBULATORY_CARE_PROVIDER_SITE_OTHER): Payer: BLUE CROSS/BLUE SHIELD | Admitting: Internal Medicine

## 2014-11-13 VITALS — BP 144/98 | HR 92 | Temp 98.6°F | Resp 16 | Ht 68.25 in | Wt 211.0 lb

## 2014-11-13 DIAGNOSIS — I10 Essential (primary) hypertension: Secondary | ICD-10-CM

## 2014-11-13 DIAGNOSIS — E291 Testicular hypofunction: Secondary | ICD-10-CM

## 2014-11-13 DIAGNOSIS — E349 Endocrine disorder, unspecified: Secondary | ICD-10-CM

## 2014-11-13 DIAGNOSIS — R7303 Prediabetes: Secondary | ICD-10-CM

## 2014-11-13 DIAGNOSIS — Z125 Encounter for screening for malignant neoplasm of prostate: Secondary | ICD-10-CM

## 2014-11-13 DIAGNOSIS — R7309 Other abnormal glucose: Secondary | ICD-10-CM

## 2014-11-13 DIAGNOSIS — Z1212 Encounter for screening for malignant neoplasm of rectum: Secondary | ICD-10-CM

## 2014-11-13 DIAGNOSIS — E559 Vitamin D deficiency, unspecified: Secondary | ICD-10-CM

## 2014-11-13 DIAGNOSIS — R5383 Other fatigue: Secondary | ICD-10-CM

## 2014-11-13 DIAGNOSIS — E782 Mixed hyperlipidemia: Secondary | ICD-10-CM

## 2014-11-13 DIAGNOSIS — Z79899 Other long term (current) drug therapy: Secondary | ICD-10-CM

## 2014-11-13 DIAGNOSIS — F3162 Bipolar disorder, current episode mixed, moderate: Secondary | ICD-10-CM

## 2014-11-13 LAB — HEMOGLOBIN A1C
Hgb A1c MFr Bld: 5.7 % — ABNORMAL HIGH (ref <5.7)
MEAN PLASMA GLUCOSE: 117 mg/dL — AB (ref <117)

## 2014-11-13 LAB — CBC WITH DIFFERENTIAL/PLATELET
Basophils Absolute: 0 10*3/uL (ref 0.0–0.1)
Basophils Relative: 0 % (ref 0–1)
EOS ABS: 0.4 10*3/uL (ref 0.0–0.7)
Eosinophils Relative: 3 % (ref 0–5)
HEMATOCRIT: 47.7 % (ref 39.0–52.0)
HEMOGLOBIN: 16.8 g/dL (ref 13.0–17.0)
LYMPHS ABS: 2.3 10*3/uL (ref 0.7–4.0)
Lymphocytes Relative: 20 % (ref 12–46)
MCH: 32.6 pg (ref 26.0–34.0)
MCHC: 35.2 g/dL (ref 30.0–36.0)
MCV: 92.4 fL (ref 78.0–100.0)
MPV: 9.7 fL (ref 8.6–12.4)
Monocytes Absolute: 0.6 10*3/uL (ref 0.1–1.0)
Monocytes Relative: 5 % (ref 3–12)
NEUTROS ABS: 8.4 10*3/uL — AB (ref 1.7–7.7)
NEUTROS PCT: 72 % (ref 43–77)
Platelets: 267 10*3/uL (ref 150–400)
RBC: 5.16 MIL/uL (ref 4.22–5.81)
RDW: 12.6 % (ref 11.5–15.5)
WBC: 11.7 10*3/uL — ABNORMAL HIGH (ref 4.0–10.5)

## 2014-11-13 MED ORDER — LORAZEPAM 1 MG PO TABS: 1.0000 mg | ORAL_TABLET | Freq: Two times a day (BID) | ORAL | Status: DC

## 2014-11-13 MED ORDER — LISINOPRIL 20 MG PO TABS: 20.0000 mg | ORAL_TABLET | Freq: Every day | ORAL | Status: DC

## 2014-11-13 MED ORDER — LAMOTRIGINE 25 MG PO TABS: 25.0000 mg | ORAL_TABLET | Freq: Two times a day (BID) | ORAL | Status: DC

## 2014-11-13 NOTE — Progress Notes (Signed)
Patient ID: Eric Bentley, male   DOB: 07/13/1976, 39 y.o.   MRN: [id number]  Complete Physical  Assessment and Plan:  1. Essential hypertension -TSH - Urine Microscopic - Microalbumin / creatinine urine ratio - EKG 12-Lead - [place], RETROPERITNL ABD,  LTD -patient has been off lisinopril for "months"  restart - lisinopril (PRINIVIL,ZESTRIL) 20 MG tablet; Take 1 tablet (20 mg total) by mouth daily.  Dispense: 90 tablet; Refill: 0  2. Hyperlipidemia -cont diet and exercise - Lipid panel  3. Prediabetes -cont diet and exercise - Hemoglobin A1c - Insulin, fasting  4. Vitamin D deficiency -supplement suggested - Vit D  25 hydroxy (rtn osteoporosis monitoring)  5. Medication management  - CBC with Differential/Platelet - BASIC METABOLIC PANEL WITH GFR - Magnesium - Hepatic function panel  6. Screening for rectal cancer  - POC Hemoccult Bld/Stl (3-Cd Home Screen); Future  7. Other fatigue  - Vitamin B12 - Iron and TIBC  8. Screening for prostate cancer  - PSA  9. Testosterone deficiency  - Testosterone   10. Bipolar 1 disorder, mixed, moderate -suggested therapy -patient cannot tolerate xanax - lamoTRIgine (LAMICTAL) 25 MG tablet; Take 1 tablet (25 mg total) by mouth 2 (two) times daily.  Dispense: 90 tablet; Refill: 0 - LORazepam (ATIVAN) 1 MG tablet; Take 1 tablet (1 mg total) by mouth 2 (two) times daily.  Dispense: 60 tablet; Refill: 0    Discussed med's effects and SE's. Screening labs and tests as requested with regular follow-up as recommended.  HPI Patient presents for a complete physical.   His blood pressure has not been controlled at home.  He is not taking his blood pressure at home.  Today their BP is BP: (!) 144/98 mmHg He does workout, but he does work in [employer] and has a very physical job. He denies chest pain, shortness of breath, dizziness.   He is not on cholesterol medication and denies myalgias. His cholesterol is not at goal. The  cholesterol last visit was:   Lab Results  Component Value Date   CHOL 253* 12/08/2013   HDL 56 12/08/2013   LDLCALC NOT CALC 12/08/2013   TRIG 544* 12/08/2013   CHOLHDL 4.5 12/08/2013  He reports that he is on Omega XL which is a fish oil supplement.  He has not been working on diet and exercise for prediabetes, he is not on bASA, he is on ACE/ARB and denies foot ulcerations, increased appetite, nausea, paresthesia of the feet, polydipsia, polyuria, visual disturbances, vomiting and weight loss. Last A1C in the office was:  Lab Results  Component Value Date   HGBA1C 5.4 10/13/2013    Patient is on Vitamin D supplement.   Lab Results  Component Value Date   VD25OH 29* 10/13/2013      Last PSA was: Lab Results  Component Value Date   PSA 0.76 10/13/2013  .  Denies BPH symptoms daytime frequency, double voiding, dysuria, hematuria, hesitancy, incontinence, intermittency, nocturia, sensation of incomplete bladder emptying, suprapubic pain, urgency or weak urinary stream.  Smokes 2 packs per day.  He is not ready to quit smoking.    Current Medications:  Current Outpatient Prescriptions on File Prior to Visit  Medication Sig Dispense Refill  . Cholecalciferol (VITAMIN D3) 2000 UNITS capsule Take 1 capsule (2,000 Units total) by mouth daily.    . Garlic (GARLIQUE) 400 MG TBEC Take 1 tablet (400 mg total) by mouth daily.  0  . Multiple Vitamins-Minerals (MULTIVITAMIN WITH MINERALS) tablet Take 1 tablet  by mouth daily. 120 tablet 2  . Omega-3 Fatty Acids (FISH OIL) 1200 MG CAPS Take 0.8333 capsules (1,000 mg total) by mouth daily.    Marland Kitchen tiZANidine (ZANAFLEX) 4 MG tablet Take 1 tablet (4 mg total) by mouth 3 (three) times daily. 90 tablet 1  . ATENOLOL PO Take by mouth. Patient unsure of dose of Rx    . citalopram (CELEXA) 20 MG tablet Take 1 tablet (20 mg total) by mouth daily. (Patient not taking: Reported on 11/13/2014) 90 tablet 1  . lamoTRIgine (LAMICTAL) 25 MG tablet Take 1 tablet  (25 mg total) by mouth 2 (two) times daily. (Patient not taking: Reported on 11/13/2014)    . lisinopril (PRINIVIL,ZESTRIL) 20 MG tablet Take 20 mg by mouth daily.    Marland Kitchen LORazepam (ATIVAN) 1 MG tablet Take 1 mg by mouth 2 (two) times daily.     No current facility-administered medications on file prior to visit.    Health Maintenance:  Immunization History  Administered Date(s) Administered  . PPD Test 10/13/2013  . Pneumococcal Polysaccharide-23 10/13/2013  . Tdap 10/13/2013    Tetanus: Pneumovax: Prevnar 13: Flu vaccine: Zostavax: DEXA: Colonoscopy: EGD: Eye Exam: Dentist:  Patient Care Team: Lucky Cowboy, MD as PCP - General (Internal Medicine)  Allergies: No Known Allergies  Medical History:  Past Medical History  Diagnosis Date  . Hyperlipidemia   . Hypertension   . Depression   . Anxiety   . Unspecified essential hypertension 10/14/2013  . Mixed hyperlipidemia 10/14/2013  . Bipolar 1 disorder, mixed, moderate 10/14/2013    Surgical History: No past surgical history on file.  Family History:  Family History  Problem Relation Age of Onset  . Hyperlipidemia Mother   . Hypertension Mother   . Heart disease Mother 52    fatal  . Early death Mother   . Cancer Sister     breast  . Depression Sister   . Heart disease Brother   . Hyperlipidemia Brother   . Hypertension Brother   . Stroke Brother 28    OBESE 400#  . Heart disease Paternal Grandfather   . Hyperlipidemia Paternal Grandfather   . Hypertension Paternal Grandfather     Social History:   History  Substance Use Topics  . Smoking status: Current Every Day Smoker -- 0.50 packs/day for 15 years  . Smokeless tobacco: Not on file  . Alcohol Use: 0.0 oz/week    0 Berkley drinks or equivalent per week     Comment: occasional    Review of Systems:  Review of Systems  Constitutional: Negative for fever and chills.  HENT: Positive for congestion. Negative for ear discharge, ear pain, sore  throat and tinnitus.   Eyes: Negative.   Respiratory: Positive for cough and wheezing. Negative for hemoptysis, sputum production and shortness of breath.   Cardiovascular: Positive for palpitations (with anxiety). Negative for chest pain and leg swelling.  Gastrointestinal: Negative for nausea, vomiting, abdominal pain, diarrhea, constipation, blood in stool and melena.  Genitourinary: Negative.   Musculoskeletal: Negative.   Neurological: Negative.  Negative for headaches.  Psychiatric/Behavioral: Negative.     Physical Exam: Estimated body mass index is 31.83 kg/(m^2) as calculated from the following:   Height as of this encounter: 5' 8.25" (1.734 m).   Weight as of this encounter: 211 lb (95.709 kg). BP 144/98 mmHg  Pulse 92  Temp(Src) 98.6 F (37 C) (Temporal)  Resp 16  Ht 5' 8.25" (1.734 m)  Wt 211 lb (95.709 kg)  BMI 31.83 kg/m2  General Appearance: Well nourished, in no apparent distress.  Eyes: PERRLA, EOMs, conjunctiva no swelling or erythema ENT/Mouth: Ear canals clear bilaterally with no erythema, swelling, discharge.  TMs normal bilaterally with no erythema, bulging, or retractions.  Oropharynx clear and moist with no exudate, swelling, or erythema.  Dentition normal.   Neck: Supple, thyroid normal. No bruits, JVD, cervical adenopathy Respiratory: Respiratory effort normal, BS equal bilaterally without rales, rhonchi, wheezing or stridor.  Cardio: RRR without murmurs, rubs or gallops. Brisk peripheral pulses without edema.  Chest: symmetric, with normal excursions Abdomen: Soft, nontender, no guarding, rebound, hernias, masses, or organomegaly. Genitourinary:  No noted inguinal hernias.   Musculoskeletal: Full ROM all peripheral extremities,5/5 strength, and normal gait.  Skin: Warm, dry without rashes, lesions, ecchymosis. Neuro: A&Ox3, Cranial nerves intact, reflexes equal bilaterally. Normal muscle tone, no cerebellar symptoms. Sensation intact.  Psych: Normal  affect, Insight and Judgment appropriate.   EKG: WNL no changes.  AORTA SCAN: WNL  FORCUCCI, Eric Bentley 9:47 AM [facility]

## 2014-11-13 NOTE — Patient Instructions (Signed)

## 2014-11-14 LAB — LIPID PANEL
CHOLESTEROL: 246 mg/dL — AB (ref 0–200)
HDL: 53 mg/dL (ref >=40)
Total CHOL/HDL Ratio: 4.6 Ratio
Triglycerides: 440 mg/dL — ABNORMAL HIGH (ref <150)

## 2014-11-14 LAB — MAGNESIUM: MAGNESIUM: 2.2 mg/dL (ref 1.5–2.5)

## 2014-11-14 LAB — HEPATIC FUNCTION PANEL
ALT: 46 U/L (ref 0–53)
AST: 26 U/L (ref 0–37)
Albumin: 4.8 g/dL (ref 3.5–5.2)
Alkaline Phosphatase: 67 U/L (ref 39–117)
BILIRUBIN INDIRECT: 0.5 mg/dL (ref 0.2–1.2)
Bilirubin, Direct: 0.1 mg/dL (ref 0.0–0.3)
Total Bilirubin: 0.6 mg/dL (ref 0.2–1.2)
Total Protein: 7.2 g/dL (ref 6.0–8.3)

## 2014-11-14 LAB — INSULIN, FASTING: Insulin fasting, serum: 6.2 u[IU]/mL (ref 2.0–19.6)

## 2014-11-14 LAB — URINALYSIS, MICROSCOPIC ONLY
Casts: NONE SEEN
Crystals: NONE SEEN
SQUAMOUS EPITHELIAL / LPF: NONE SEEN

## 2014-11-14 LAB — IRON AND TIBC
%SAT: 39 % (ref 20–55)
IRON: 148 ug/dL (ref 42–165)
TIBC: 375 ug/dL (ref 215–435)
UIBC: 227 ug/dL (ref 125–400)

## 2014-11-14 LAB — VITAMIN D 25 HYDROXY (VIT D DEFICIENCY, FRACTURES): Vit D, 25-Hydroxy: 28 ng/mL — ABNORMAL LOW (ref 30–100)

## 2014-11-14 LAB — BASIC METABOLIC PANEL WITH GFR
BUN: 14 mg/dL (ref 6–23)
CO2: 25 mEq/L (ref 19–32)
Calcium: 10 mg/dL (ref 8.4–10.5)
Chloride: 105 mEq/L (ref 96–112)
Creat: 1.14 mg/dL (ref 0.50–1.35)
GFR, Est African American: >89 mL/min
GFR, Est Non African American: 80 mL/min
GLUCOSE: 111 mg/dL — AB (ref 70–99)
Potassium: 4.7 mEq/L (ref 3.5–5.3)
Sodium: 141 mEq/L (ref 135–145)

## 2014-11-14 LAB — VITAMIN B12: VITAMIN B 12: 374 pg/mL (ref 211–911)

## 2014-11-14 LAB — TSH: TSH: 0.784 u[IU]/mL (ref 0.350–4.500)

## 2014-11-14 LAB — MICROALBUMIN / CREATININE URINE RATIO
Creatinine, Urine: 100.1 mg/dL
MICROALB/CREAT RATIO: 9 mg/g (ref 0.0–30.0)
Microalb, Ur: 0.9 mg/dL (ref <2.0)

## 2014-11-14 LAB — PSA: PSA: 0.64 ng/mL (ref <=4.00)

## 2014-11-14 LAB — TESTOSTERONE: Testosterone: 296 ng/dL — ABNORMAL LOW (ref 300–890)

## 2014-11-24 ENCOUNTER — Other Ambulatory Visit: Payer: Self-pay | Admitting: Internal Medicine

## 2014-11-24 MED ORDER — ROSUVASTATIN CALCIUM 20 MG PO TABS: ORAL_TABLET | ORAL | Status: DC

## 2014-11-24 MED ORDER — TESTOSTERONE CYPIONATE 200 MG/ML IM SOLN: 100.0000 mg | INTRAMUSCULAR | Status: DC

## 2014-11-25 NOTE — Progress Notes (Signed)
Rx for testosterone called into pharmacy.  Left message for patient to call back and schedule NV.  Fwd message to the front to call patient and schedule NV if no call back from patient today.

## 2014-12-01 ENCOUNTER — Ambulatory Visit: Payer: Self-pay

## 2015-01-08 ENCOUNTER — Encounter: Payer: Self-pay | Admitting: Internal Medicine

## 2015-01-08 ENCOUNTER — Ambulatory Visit (INDEPENDENT_AMBULATORY_CARE_PROVIDER_SITE_OTHER): Payer: BLUE CROSS/BLUE SHIELD | Admitting: Internal Medicine

## 2015-01-08 VITALS — BP 162/100 | HR 112 | Temp 98.6°F | Resp 18 | Ht 68.25 in | Wt 205.0 lb

## 2015-01-08 DIAGNOSIS — M5441 Lumbago with sciatica, right side: Secondary | ICD-10-CM

## 2015-01-08 MED ORDER — PREDNISONE 20 MG PO TABS: ORAL_TABLET | ORAL | Status: DC

## 2015-01-08 MED ORDER — MELOXICAM 15 MG PO TABS: 15.0000 mg | ORAL_TABLET | Freq: Every day | ORAL | Status: DC

## 2015-01-08 MED ORDER — HYDROCODONE-ACETAMINOPHEN 5-325 MG PO TABS
1.0000 | ORAL_TABLET | Freq: Four times a day (QID) | ORAL | Status: DC | PRN
Start: 2015-01-08 — End: 2015-06-23

## 2015-01-08 MED ORDER — TIZANIDINE HCL 4 MG PO TABS: 4.0000 mg | ORAL_TABLET | Freq: Three times a day (TID) | ORAL | Status: DC | PRN

## 2015-01-08 NOTE — Patient Instructions (Signed)
Sciatica Sciatica is pain, weakness, numbness, or tingling along the path of the sciatic nerve. The nerve starts in the lower back and runs down the back of each leg. The nerve controls the muscles in the lower leg and in the back of the knee, while also providing sensation to the back of the thigh, lower leg, and the sole of your foot. Sciatica is a symptom of another medical condition. For instance, nerve damage or certain conditions, such as a herniated disk or bone spur on the spine, pinch or put pressure on the sciatic nerve. This causes the pain, weakness, or other sensations normally associated with sciatica. Generally, sciatica only affects one side of the body. CAUSES   Herniated or slipped disc.  Degenerative disk disease.  A pain disorder involving the narrow muscle in the buttocks (piriformis syndrome).  Pelvic injury or fracture.  Pregnancy.  Tumor (rare). SYMPTOMS  Symptoms can vary from mild to very severe. The symptoms usually travel from the low back to the buttocks and down the back of the leg. Symptoms can include:  Mild tingling or dull aches in the lower back, leg, or hip.  Numbness in the back of the calf or sole of the foot.  Burning sensations in the lower back, leg, or hip.  Sharp pains in the lower back, leg, or hip.  Leg weakness.  Severe back pain inhibiting movement. These symptoms may get worse with coughing, sneezing, laughing, or prolonged sitting or standing. Also, being overweight may worsen symptoms. DIAGNOSIS  Your caregiver will perform a physical exam to look for common symptoms of sciatica. He or she may ask you to do certain movements or activities that would trigger sciatic nerve pain. Other tests may be performed to find the cause of the sciatica. These may include:  Blood tests.  X-rays.  Imaging tests, such as an MRI or CT scan. TREATMENT  Treatment is directed at the cause of the sciatic pain. Sometimes, treatment is not necessary  and the pain and discomfort goes away on its own. If treatment is needed, your caregiver may suggest:  Over-the-counter medicines to relieve pain.  Prescription medicines, such as anti-inflammatory medicine, muscle relaxants, or narcotics.  Applying heat or ice to the painful area.  Steroid injections to lessen pain, irritation, and inflammation around the nerve.  Reducing activity during periods of pain.  Exercising and stretching to strengthen your abdomen and improve flexibility of your spine. Your caregiver may suggest losing weight if the extra weight makes the back pain worse.  Physical therapy.  Surgery to eliminate what is pressing or pinching the nerve, such as a bone spur or part of a herniated disk. HOME CARE INSTRUCTIONS   Only take over-the-counter or prescription medicines for pain or discomfort as directed by your caregiver.  Apply ice to the affected area for 20 minutes, 3-4 times a day for the first 48-72 hours. Then try heat in the same way.  Exercise, stretch, or perform your usual activities if these do not aggravate your pain.  Attend physical therapy sessions as directed by your caregiver.  Keep all follow-up appointments as directed by your caregiver.  Do not wear high heels or shoes that do not provide proper support.  Check your mattress to see if it is too soft. A firm mattress may lessen your pain and discomfort. SEEK IMMEDIATE MEDICAL CARE IF:   You lose control of your bowel or bladder (incontinence).  You have increasing weakness in the lower back, pelvis, buttocks,   or legs.  You have redness or swelling of your back.  You have a burning sensation when you urinate.  You have pain that gets worse when you lie down or awakens you at night.  Your pain is worse than you have experienced in the past.  Your pain is lasting longer than 4 weeks.  You are suddenly losing weight without reason. MAKE SURE YOU:  Understand these  instructions.  Will watch your condition.  Will get help right away if you are not doing well or get worse. Document Released: 08/08/2001 Document Revised: 02/13/2012 Document Reviewed: 12/24/2011 Exit[employer] Patient Information 2015 [employer]. This information is not intended to replace advice given to you by your health care provider. Make sure you discuss any questions you have with your health care provider.  

## 2015-01-08 NOTE — Progress Notes (Signed)
Subjective:    Patient ID: Eric Bentley, male    DOB: 02/08/1976, 39 y.o.   MRN: [id number]  Back Pain Associated symptoms include numbness. Pertinent negatives include no abdominal pain, dysuria, fever or weakness.   Patient presents to the office for evaluation of increasing low back pain x 1 day.  He reports that he was doing a lot of heavy lifting cleaning out a trailer yesterday.  He reports that it has progressed this morning.  He reports that it is a sharp pain that goes up his spine into his right arm and right leg.  He reports that this happened in February as well.  He reports that he has been taking ibuprofen at home with no relief.  He reports that he has not been using anything else.  He reports that this is similar to when he had pain back in February.  He reports that he has never been to an orthopedist for this pain in the past.  He reports some tingling and numbness in his right leg.  He reports that he also get electric shocks.  He reports bending givens him pain.  He has some relief with sitting leaning forward.  He has had no loss of bowel or bladder, no tingling or numbness in the genitals.  No previous surgery or broken bones Review of Systems  Constitutional: Negative for fever, chills and fatigue.  Gastrointestinal: Negative for nausea, vomiting and abdominal pain.  Genitourinary: Negative for dysuria, urgency, frequency, hematuria and difficulty urinating.  Musculoskeletal: Positive for back pain. Negative for neck pain and neck stiffness.  Neurological: Positive for numbness. Negative for dizziness and weakness.       Objective:   Physical Exam  Constitutional: He is oriented to person, place, and time. He appears well-developed and well-nourished. No distress.  HENT:  Head: Normocephalic and atraumatic.  Mouth/Throat: Oropharynx is clear and moist. No oropharyngeal exudate.  Eyes: Conjunctivae are normal. No scleral icterus.  Neck: Normal range of motion. Neck  supple. No JVD present. No thyromegaly present.  Cardiovascular: Normal rate, regular rhythm, normal heart sounds and intact distal pulses.  Exam reveals no gallop and no friction rub.   No murmur heard. Pulmonary/Chest: Effort normal and breath sounds normal. No respiratory distress. He has no wheezes. He has no rales. He exhibits no tenderness.  Abdominal: Soft. Bowel sounds are normal. He exhibits no distension and no mass. There is no tenderness. There is no rebound and no guarding.  Musculoskeletal:  Patient rises slowly from sitting to standing.  They walk without an antalgic gait.  There is no evidence of erythema, ecchymosis, or gross deformity.  There is tenderness to palpation over right lumbar paraspinal muscles and bilateral thoracic paraspinal muscles.  No TTP to the spine in all 3 regions.  Active ROM is limited due to pain.  Sensation to light touch is intact over all extremities.  Strength is symmetric and equal in all extremities.   Neurological: He is alert and oriented to person, place, and time.  Skin: Skin is warm and dry. He is not diaphoretic.  Psychiatric: He has a normal mood and affect. His behavior is normal. Judgment and thought content normal.  Nursing note and vitals reviewed.         Assessment & Plan:    1. Right-sided low back pain with right-sided sciatica NO neuro deficits.  No signs of cauda equina.  Cauda equina symptoms discussed patient advised to go to ER.  If no improvement  patient likely needs to go to ortho for visit.    - tiZANidine (ZANAFLEX) 4 MG tablet; Take 1 tablet (4 mg total) by mouth every 8 (eight) hours as needed for muscle spasms.  Dispense: 90 tablet; Refill: 1 - predniSONE (DELTASONE) 20 MG tablet; 3 tabs po day one, then 2 tabs daily x 4 days  Dispense: 11 tablet; Refill: 0 - meloxicam (MOBIC) 15 MG tablet; Take 1 tablet (15 mg total) by mouth daily.  Dispense: 30 tablet; Refill: 2 - HYDROcodone-acetaminophen (NORCO) 5-325 MG per  tablet; Take 1 tablet by mouth every 6 (six) hours as needed for severe pain.  Dispense: 30 tablet; Refill: 0

## 2015-02-18 ENCOUNTER — Encounter: Payer: Self-pay | Admitting: Internal Medicine

## 2015-02-18 ENCOUNTER — Ambulatory Visit (INDEPENDENT_AMBULATORY_CARE_PROVIDER_SITE_OTHER): Payer: BLUE CROSS/BLUE SHIELD | Admitting: Internal Medicine

## 2015-02-18 VITALS — BP 136/90 | HR 118 | Temp 98.2°F | Resp 18 | Ht 68.25 in | Wt 206.0 lb

## 2015-02-18 DIAGNOSIS — E782 Mixed hyperlipidemia: Secondary | ICD-10-CM

## 2015-02-18 DIAGNOSIS — E559 Vitamin D deficiency, unspecified: Secondary | ICD-10-CM

## 2015-02-18 DIAGNOSIS — I10 Essential (primary) hypertension: Secondary | ICD-10-CM

## 2015-02-18 DIAGNOSIS — M5441 Lumbago with sciatica, right side: Secondary | ICD-10-CM

## 2015-02-18 DIAGNOSIS — Z79899 Other long term (current) drug therapy: Secondary | ICD-10-CM

## 2015-02-18 DIAGNOSIS — E669 Obesity, unspecified: Secondary | ICD-10-CM

## 2015-02-18 DIAGNOSIS — R7309 Other abnormal glucose: Secondary | ICD-10-CM

## 2015-02-18 DIAGNOSIS — F3162 Bipolar disorder, current episode mixed, moderate: Secondary | ICD-10-CM

## 2015-02-18 DIAGNOSIS — R7303 Prediabetes: Secondary | ICD-10-CM

## 2015-02-18 LAB — CBC WITH DIFFERENTIAL/PLATELET
BASOS PCT: 0 % (ref 0–1)
Basophils Absolute: 0 10*3/uL (ref 0.0–0.1)
EOS ABS: 0.3 10*3/uL (ref 0.0–0.7)
EOS PCT: 2 % (ref 0–5)
HCT: 46.3 % (ref 39.0–52.0)
HEMOGLOBIN: 16.1 g/dL (ref 13.0–17.0)
LYMPHS ABS: 2.5 10*3/uL (ref 0.7–4.0)
LYMPHS PCT: 19 % (ref 12–46)
MCH: 32.6 pg (ref 26.0–34.0)
MCHC: 34.8 g/dL (ref 30.0–36.0)
MCV: 93.7 fL (ref 78.0–100.0)
MPV: 9.8 fL (ref 8.6–12.4)
Monocytes Absolute: 0.7 10*3/uL (ref 0.1–1.0)
Monocytes Relative: 5 % (ref 3–12)
Neutro Abs: 9.8 10*3/uL — ABNORMAL HIGH (ref 1.7–7.7)
Neutrophils Relative %: 74 % (ref 43–77)
Platelets: 277 10*3/uL (ref 150–400)
RBC: 4.94 MIL/uL (ref 4.22–5.81)
RDW: 12.4 % (ref 11.5–15.5)
WBC: 13.2 10*3/uL — ABNORMAL HIGH (ref 4.0–10.5)

## 2015-02-18 LAB — TSH: TSH: 0.761 u[IU]/mL (ref 0.350–4.500)

## 2015-02-18 MED ORDER — TIZANIDINE HCL 4 MG PO TABS: 4.0000 mg | ORAL_TABLET | Freq: Three times a day (TID) | ORAL | Status: DC | PRN

## 2015-02-18 MED ORDER — LISINOPRIL 20 MG PO TABS: 20.0000 mg | ORAL_TABLET | Freq: Every day | ORAL | Status: DC

## 2015-02-18 MED ORDER — MELOXICAM 15 MG PO TABS: 15.0000 mg | ORAL_TABLET | Freq: Every day | ORAL | Status: DC

## 2015-02-18 NOTE — Progress Notes (Signed)
Patient ID: Eric Bentley, male   DOB: Apr 18, 1976, 39 y.o.   MRN: [id number]  Assessment and Plan:  Hypertension:  -restart lisinopril.  10 mg in the morning and 10 mg at nighttime.   -contact office if still dizzy with dose changes -monitor blood pressure at home.  -Continue DASH diet.   -Reminder to go to the ER if any CP, SOB, nausea, dizziness, severe HA, changes vision/speech, left arm numbness and tingling, and jaw pain.  Cholesterol: -Continue diet and exercise.  -Check cholesterol.   Pre-diabetes: -Continue diet and exercise.  -Check A1C  Vitamin D Def -check level -continue medications.   Bipolar -restart lamictal and take it consistently  Back pain -referral to ortho -refill mobic and tizanidine -no further pain prescriptions until evaluated  Continue diet and meds as discussed. Further disposition pending results of labs.  HPI 39 y.o. male  presents for 3 month follow up with hypertension, hyperlipidemia, prediabetes and vitamin D.   His blood pressure has not been controlled at home, today their BP is BP: 136/90 mmHg.   He does workout.  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  He reports that he has not been consistently been taking his lisinopril as it makes him dizzy.  He takes it approximately every 3 days.  He denies chest pain, shortness of breath, dizziness.   He is not on cholesterol medication and denies myalgias. His cholesterol is not at goal. The cholesterol last visit was:   Lab Results  Component Value Date   CHOL 246* 11/13/2014   HDL 53 11/13/2014   LDLCALC NOT CALC 11/13/2014   TRIG 440* 11/13/2014   CHOLHDL 4.6 11/13/2014     He has not been working on diet and exercise for prediabetes, and denies foot ulcerations, hyperglycemia, hypoglycemia , increased appetite, nausea, paresthesia of the feet, polydipsia, polyuria, visual disturbances, vomiting and weight loss. Last A1C in the office was:  Lab Results  Component Value Date   HGBA1C  5.7* 11/13/2014    Patient is on Vitamin D supplement.  Lab Results  Component Value Date   VD25OH 28* 11/13/2014      He has not been taking his lamictal because he feels like he does not need it.  He does admit to a lot more anxiety and also a lot more anger.  He feels like this medication helps when he takes it then he feels good and stops it.  Patient does continue to complain of low back pain.  He reports that it is better and feels that the norco and the tizanidine feels significantly better but he needs refills.  He has not seen ortho.  This is an ongoing issue.    Current Medications:  Current Outpatient Prescriptions on File Prior to Visit  Medication Sig Dispense Refill  . HYDROcodone-acetaminophen (NORCO) 5-325 MG per tablet Take 1 tablet by mouth every 6 (six) hours as needed for severe pain. 30 tablet 0  . LORazepam (ATIVAN) 1 MG tablet Take 1 tablet (1 mg total) by mouth 2 (two) times daily. 60 tablet 0  . Omega-3 Fatty Acids (FISH OIL) 1200 MG CAPS Take 0.8333 capsules (1,000 mg total) by mouth daily.    Marland Kitchen tiZANidine (ZANAFLEX) 4 MG tablet Take 1 tablet (4 mg total) by mouth every 8 (eight) hours as needed for muscle spasms. 90 tablet 1  . lamoTRIgine (LAMICTAL) 25 MG tablet Take 1 tablet (25 mg total) by mouth 2 (two) times daily. (Patient not taking: Reported on  01/08/2015) 90 tablet 0  . lisinopril (PRINIVIL,ZESTRIL) 20 MG tablet Take 1 tablet (20 mg total) by mouth daily. (Patient not taking: Reported on 01/08/2015) 90 tablet 0   No current facility-administered medications on file prior to visit.    Medical History:  Past Medical History  Diagnosis Date  . Hyperlipidemia   . Hypertension   . Depression   . Anxiety   . Unspecified essential hypertension 10/14/2013  . Mixed hyperlipidemia 10/14/2013  . Bipolar 1 disorder, mixed, moderate 10/14/2013    Allergies: No Known Allergies   Review of Systems:  Review of Systems  Constitutional: Negative for fever,  chills, weight loss and malaise/fatigue.  HENT: Negative for congestion, ear pain and sore throat.   Eyes: Negative.   Respiratory: Positive for cough and shortness of breath. Negative for wheezing.   Cardiovascular: Negative for chest pain, palpitations and leg swelling.  Gastrointestinal: Negative for heartburn, nausea, vomiting, abdominal pain, diarrhea, constipation, blood in stool and melena.  Genitourinary: Negative for dysuria, urgency and frequency.  Skin: Negative.   Neurological: Negative for dizziness, sensory change and headaches.  Psychiatric/Behavioral: Positive for depression. The patient is nervous/anxious. The patient does not have insomnia.     Family history- Review and unchanged  Social history- Review and unchanged  Physical Exam: BP 136/90 mmHg  Pulse 118  Temp(Src) 98.2 F (36.8 C) (Temporal)  Resp 18  Ht 5' 8.25" (1.734 m)  Wt 206 lb (93.441 kg)  BMI 31.08 kg/m2 Wt Readings from Last 3 Encounters:  02/18/15 206 lb (93.441 kg)  01/08/15 205 lb (92.987 kg)  11/13/14 211 lb (95.709 kg)    General Appearance: Well nourished well developed, in no apparent distress. Eyes: PERRLA, EOMs, conjunctiva no swelling or erythema ENT/Mouth: Ear canals normal without obstruction, swelling, erythma, discharge.  TMs normal bilaterally.  Oropharynx moist, clear, without exudate, or postoropharyngeal swelling. Neck: Supple, thyroid normal,no cervical adenopathy  Respiratory: Respiratory effort normal, Breath sounds clear A&P without rhonchi, wheeze, or rale.  No retractions, no accessory usage. Cardio: RRR with no MRGs. Brisk peripheral pulses without edema.  Abdomen: Soft, + BS,  Non tender, no guarding, rebound, hernias, masses. Musculoskeletal: Full ROM, 5/5 strength, Normal gait Skin: Warm, dry without rashes, lesions, ecchymosis.  Neuro: Awake and oriented X 3, Cranial nerves intact. Normal muscle tone, no cerebellar symptoms. Psych: Normal affect, Insight and  Judgment appropriate.    FORCUCCI, Kaenan Jake, PA-C 9:57 AM [facility]

## 2015-02-19 ENCOUNTER — Other Ambulatory Visit: Payer: Self-pay | Admitting: Internal Medicine

## 2015-02-19 DIAGNOSIS — E785 Hyperlipidemia, unspecified: Secondary | ICD-10-CM

## 2015-02-19 DIAGNOSIS — E781 Pure hyperglyceridemia: Secondary | ICD-10-CM

## 2015-02-19 LAB — BASIC METABOLIC PANEL WITH GFR
BUN: 16 mg/dL (ref 6–23)
CHLORIDE: 102 meq/L (ref 96–112)
CO2: 22 meq/L (ref 19–32)
Calcium: 9.9 mg/dL (ref 8.4–10.5)
Creat: 1.18 mg/dL (ref 0.50–1.35)
GFR, Est African American: 89 mL/min
GFR, Est Non African American: 77 mL/min
Glucose, Bld: 83 mg/dL (ref 70–99)
Potassium: 4.6 mEq/L (ref 3.5–5.3)
Sodium: 142 mEq/L (ref 135–145)

## 2015-02-19 LAB — HEMOGLOBIN A1C
HEMOGLOBIN A1C: 5.6 % (ref <5.7)
Mean Plasma Glucose: 114 mg/dL (ref <117)

## 2015-02-19 LAB — LIPID PANEL
CHOL/HDL RATIO: 5.9 ratio
CHOLESTEROL: 259 mg/dL — AB (ref 0–200)
HDL: 44 mg/dL (ref >=40)
TRIGLYCERIDES: 1229 mg/dL — AB (ref <150)

## 2015-02-19 LAB — MAGNESIUM: MAGNESIUM: 2.2 mg/dL (ref 1.5–2.5)

## 2015-02-19 LAB — VITAMIN D 25 HYDROXY (VIT D DEFICIENCY, FRACTURES): Vit D, 25-Hydroxy: 24 ng/mL — ABNORMAL LOW (ref 30–100)

## 2015-02-19 LAB — HEPATIC FUNCTION PANEL
ALK PHOS: 69 U/L (ref 39–117)
ALT: 89 U/L — AB (ref 0–53)
AST: 43 U/L — ABNORMAL HIGH (ref 0–37)
Albumin: 4.5 g/dL (ref 3.5–5.2)
BILIRUBIN DIRECT: 0.1 mg/dL (ref 0.0–0.3)
BILIRUBIN TOTAL: 0.6 mg/dL (ref 0.2–1.2)
Indirect Bilirubin: 0.5 mg/dL (ref 0.2–1.2)
Total Protein: 7.1 g/dL (ref 6.0–8.3)

## 2015-02-19 LAB — INSULIN, RANDOM: INSULIN: 11 u[IU]/mL (ref 2.0–19.6)

## 2015-02-19 MED ORDER — FENOFIBRATE 145 MG PO TABS: 145.0000 mg | ORAL_TABLET | Freq: Every day | ORAL | Status: DC

## 2015-02-19 MED ORDER — PRAVASTATIN SODIUM 40 MG PO TABS: 40.0000 mg | ORAL_TABLET | Freq: Every evening | ORAL | Status: DC

## 2015-03-25 ENCOUNTER — Other Ambulatory Visit: Payer: Self-pay

## 2015-03-30 ENCOUNTER — Other Ambulatory Visit: Payer: Self-pay

## 2015-04-23 ENCOUNTER — Other Ambulatory Visit: Payer: Self-pay

## 2015-04-27 ENCOUNTER — Other Ambulatory Visit: Payer: Self-pay

## 2015-04-30 ENCOUNTER — Other Ambulatory Visit: Payer: Self-pay

## 2015-05-26 ENCOUNTER — Ambulatory Visit: Payer: Self-pay | Admitting: Internal Medicine

## 2015-06-07 ENCOUNTER — Ambulatory Visit: Payer: Self-pay | Admitting: Internal Medicine

## 2015-06-16 ENCOUNTER — Encounter (HOSPITAL_COMMUNITY): Payer: Self-pay | Admitting: Neurology

## 2015-06-16 ENCOUNTER — Emergency Department (HOSPITAL_COMMUNITY)
Admission: EM | Admit: 2015-06-16 | Discharge: 2015-06-16 | Disposition: A | Discharge status: Home / Self Care | Payer: BLUE CROSS/BLUE SHIELD | Attending: Emergency Medicine | Admitting: Emergency Medicine

## 2015-06-16 DIAGNOSIS — T464X4A Poisoning by angiotensin-converting-enzyme inhibitors, undetermined, initial encounter: Secondary | ICD-10-CM | POA: Diagnosis not present

## 2015-06-16 DIAGNOSIS — I10 Essential (primary) hypertension: Secondary | ICD-10-CM | POA: Diagnosis not present

## 2015-06-16 DIAGNOSIS — Y998 Other external cause status: Secondary | ICD-10-CM | POA: Diagnosis not present

## 2015-06-16 DIAGNOSIS — E785 Hyperlipidemia, unspecified: Secondary | ICD-10-CM | POA: Diagnosis not present

## 2015-06-16 DIAGNOSIS — R062 Wheezing: Secondary | ICD-10-CM | POA: Insufficient documentation

## 2015-06-16 DIAGNOSIS — F3162 Bipolar disorder, current episode mixed, moderate: Secondary | ICD-10-CM | POA: Insufficient documentation

## 2015-06-16 DIAGNOSIS — F419 Anxiety disorder, unspecified: Secondary | ICD-10-CM | POA: Insufficient documentation

## 2015-06-16 DIAGNOSIS — T426X4A Poisoning by other antiepileptic and sedative-hypnotic drugs, undetermined, initial encounter: Secondary | ICD-10-CM | POA: Insufficient documentation

## 2015-06-16 DIAGNOSIS — Y9389 Activity, other specified: Secondary | ICD-10-CM | POA: Insufficient documentation

## 2015-06-16 DIAGNOSIS — E782 Mixed hyperlipidemia: Secondary | ICD-10-CM | POA: Insufficient documentation

## 2015-06-16 DIAGNOSIS — Y9289 Other specified places as the place of occurrence of the external cause: Secondary | ICD-10-CM | POA: Diagnosis not present

## 2015-06-16 DIAGNOSIS — R109 Unspecified abdominal pain: Secondary | ICD-10-CM | POA: Diagnosis present

## 2015-06-16 DIAGNOSIS — R1011 Right upper quadrant pain: Secondary | ICD-10-CM | POA: Diagnosis not present

## 2015-06-16 DIAGNOSIS — T50904A Poisoning by unspecified drugs, medicaments and biological substances, undetermined, initial encounter: Secondary | ICD-10-CM

## 2015-06-16 DIAGNOSIS — Z720 Tobacco use: Secondary | ICD-10-CM | POA: Insufficient documentation

## 2015-06-16 DIAGNOSIS — Z79899 Other long term (current) drug therapy: Secondary | ICD-10-CM | POA: Insufficient documentation

## 2015-06-16 DIAGNOSIS — Z791 Long term (current) use of non-steroidal anti-inflammatories (NSAID): Secondary | ICD-10-CM | POA: Insufficient documentation

## 2015-06-16 LAB — RAPID URINE DRUG SCREEN, HOSP PERFORMED
Amphetamines: NOT DETECTED
BARBITURATES: NOT DETECTED
Benzodiazepines: NOT DETECTED
Cocaine: POSITIVE — AB
Opiates: NOT DETECTED
Tetrahydrocannabinol: POSITIVE — AB

## 2015-06-16 LAB — COMPREHENSIVE METABOLIC PANEL
ALT: 22 U/L (ref 17–63)
AST: 16 U/L (ref 15–41)
Albumin: 4.2 g/dL (ref 3.5–5.0)
Alkaline Phosphatase: 60 U/L (ref 38–126)
Anion gap: 11 (ref 5–15)
BUN: 14 mg/dL (ref 6–20)
CALCIUM: 9.8 mg/dL (ref 8.9–10.3)
CO2: 26 mmol/L (ref 22–32)
Chloride: 103 mmol/L (ref 101–111)
Creatinine, Ser: 1.26 mg/dL — ABNORMAL HIGH (ref 0.61–1.24)
GLUCOSE: 96 mg/dL (ref 65–99)
POTASSIUM: 4 mmol/L (ref 3.5–5.1)
Sodium: 140 mmol/L (ref 135–145)
TOTAL PROTEIN: 6.9 g/dL (ref 6.5–8.1)
Total Bilirubin: 0.6 mg/dL (ref 0.3–1.2)

## 2015-06-16 LAB — CBC
HEMATOCRIT: 45.9 % (ref 39.0–52.0)
Hemoglobin: 16.3 g/dL (ref 13.0–17.0)
MCH: 32.6 pg (ref 26.0–34.0)
MCHC: 35.5 g/dL (ref 30.0–36.0)
MCV: 91.8 fL (ref 78.0–100.0)
Platelets: 257 10*3/uL (ref 150–400)
RBC: 5 MIL/uL (ref 4.22–5.81)
RDW: 11.9 % (ref 11.5–15.5)
WBC: 9.5 10*3/uL (ref 4.0–10.5)

## 2015-06-16 LAB — LIPASE, BLOOD: LIPASE: 38 U/L (ref 22–51)

## 2015-06-16 LAB — ETHANOL: Alcohol, Ethyl (B): <5 mg/dL (ref <5)

## 2015-06-16 LAB — ACETAMINOPHEN LEVEL: Acetaminophen (Tylenol), Serum: <10 ug/mL — ABNORMAL LOW (ref 10–30)

## 2015-06-16 LAB — SALICYLATE LEVEL: Salicylate Lvl: <4 mg/dL (ref 2.8–30.0)

## 2015-06-16 NOTE — ED Notes (Signed)
Reviewed discharge instructions and follow-up information with patient. Patient verbalized understanding.

## 2015-06-16 NOTE — ED Notes (Signed)
Staffing made aware of need for sitter 

## 2015-06-16 NOTE — ED Provider Notes (Signed)
I have also seen evaluated the patient. Patient reports taking half a bottle of lisinopril 6 days ago and drinking alcohol in order to harm himself, and again he reports 3 days ago taking handful of lamictal. States today he is here for abdominal pain, and to "check my liver." He denies active SI or HI. States he is planning on following up with [facility].   Labs showing slighly elevated creat at 1.26, just slightly up from last of 1.18. Pt is cocaine and thc positive. Otherwise unremarkable labs.   TTS consulted. Cleared from their stand point with outpatient follow up. Pt signed Engineer, manufacturing systemssafety contract. Resources given. Will dc home.   VS normal. Stable for dc.   Filed Vitals:   06/16/15 1911 06/16/15 1930 06/16/15 1945 06/16/15 2006  BP: 151/110 131/98 136/94 138/78  Pulse: 77 82 85 78  Temp:    98.7 F (37.1 C)  TempSrc:    Oral  Resp: 15 18 18 16   SpO2: 98% 95% 96% 99%     Jaynie Crumbleatyana Zaxton Angerer, PA-C 06/16/15 2027  Gwyneth SproutWhitney Plunkett, MD 06/19/15 [phone]

## 2015-06-16 NOTE — ED Notes (Signed)
PA at bedside.

## 2015-06-16 NOTE — ED Notes (Signed)
Pt here c/o RLQ abd pain today that shoots up to RUQ. On Thursday he took at least half a bottle of lisinopril. The bottle was filled in march, there were 90 pills in bottle. He was drinking and tried to overdose. On Sunday night he took Lamotrigine "a whole fist full", after drinking and he was trying to hurt himself again. Is here today because he is worried about his liver. Denies SI or HI.

## 2015-06-16 NOTE — ED Provider Notes (Signed)
CSN: [id number]     Arrival date & time 06/16/15  1655 History   First MD Initiated Contact with Patient 06/16/15 1722     Chief Complaint  Patient presents with  . Abdominal Pain  . Drug Overdose    Patient is a 39 y.o. male presenting with abdominal pain and Overdose.  Abdominal Pain Drug Overdose Associated symptoms include abdominal pain.    [name] is an 39 y.o. male with PMH including Bipolar disorder, anxiety, HTN, HLD who presents to clinic for evaluation of RUQ pain following drug overdose. Pt reports he overdosed on lisinopril on 10/13 and on Lamictal on 10/16 but the reason he is here today is that he is worried about his liver following those attempts. Pt states that he was "wasted" both nights and does not remember the attempts at all. He is unsure of how much of each drug he took. During our interview, he denies SI/HI and states "obviously I don't want to kill myself if I'm here trying to make sure my liver is okay." His main concern is his RUQ pain. States he was at work earlier today and had sudden onset sharp RUQ pain that lasted about 1 hour and resolved on its own. Denies any other symptoms. Denies chest pain, SOB, N/V/D, fever, chills. States he has been using crack and marijuana "almost every day" for the past "few weeks." States he has cut his drinking down to 2-3 times a week. Last drink was last night and he had 3 x 40 oz beers. Denies other ilicit drug use. Admits to smoking 1-2PPD.   Past Medical History  Diagnosis Date  . Hyperlipidemia   . Hypertension   . Depression   . Anxiety   . Unspecified essential hypertension 10/14/2013  . Mixed hyperlipidemia 10/14/2013  . Bipolar 1 disorder, mixed, moderate (HCC) 10/14/2013   History reviewed. No pertinent past surgical history. Family History  Problem Relation Age of Onset  . Hyperlipidemia Mother   . Hypertension Mother   . Heart disease Mother 19    fatal  . Early death Mother   . Cancer Sister     breast   . Depression Sister   . Heart disease Brother   . Hyperlipidemia Brother   . Hypertension Brother   . Stroke Brother 28    OBESE 400#  . Heart disease Paternal Grandfather   . Hyperlipidemia Paternal Grandfather   . Hypertension Paternal Grandfather    Social History  Substance Use Topics  . Smoking status: Current Every Day Smoker -- 0.50 packs/day for 15 years  . Smokeless tobacco: None  . Alcohol Use: 0.0 oz/week    0 Nehme drinks or equivalent per week     Comment: occasional    Review of Systems  Gastrointestinal: Positive for abdominal pain.  All other systems reviewed and are negative.     Allergies  Review of patient's allergies indicates no known allergies.  Home Medications   Prior to Admission medications   Medication Sig Start Date End Date Taking? Authorizing Provider  fenofibrate (TRICOR) 145 MG tablet Take 1 tablet (145 mg total) by mouth daily. 02/19/15 02/19/16  Courtney Forcucci, PA-C  HYDROcodone-acetaminophen (NORCO) 5-325 MG per tablet Take 1 tablet by mouth every 6 (six) hours as needed for severe pain. 01/08/15   Courtney Forcucci, PA-C  lamoTRIgine (LAMICTAL) 25 MG tablet Take 1 tablet (25 mg total) by mouth 2 (two) times daily. Patient not taking: Reported on 01/08/2015 11/13/14 11/14/15  Terri Piedra,  PA-C  lisinopril (PRINIVIL,ZESTRIL) 20 MG tablet Take 1 tablet (20 mg total) by mouth daily. 02/18/15   Courtney Forcucci, PA-C  LORazepam (ATIVAN) 1 MG tablet Take 1 tablet (1 mg total) by mouth 2 (two) times daily. 11/13/14   Courtney Forcucci, PA-C  meloxicam (MOBIC) 15 MG tablet Take 1 tablet (15 mg total) by mouth daily. 02/18/15 02/18/16  Courtney Forcucci, PA-C  Omega-3 Fatty Acids (FISH OIL) 1200 MG CAPS Take 0.8333 capsules (1,000 mg total) by mouth daily. 12/08/13   Lucky CowboyWilliam McKeown, MD  pravastatin (PRAVACHOL) 40 MG tablet Take 1 tablet (40 mg total) by mouth every evening. 02/19/15 02/19/16  Courtney Forcucci, PA-C  tiZANidine (ZANAFLEX) 4 MG  tablet Take 1 tablet (4 mg total) by mouth every 8 (eight) hours as needed for muscle spasms. 02/18/15   Courtney Forcucci, PA-C   BP 148/101 mmHg  Pulse 100  Temp(Src) 98.5 F (36.9 C) (Oral)  Resp 15  SpO2 97% Physical Exam  Constitutional: He is oriented to person, place, and time. He is cooperative.  HENT:  Right Ear: External ear normal.  Left Ear: External ear normal.  Nose: Nose normal.  Mouth/Throat: Oropharynx is clear and moist. No oropharyngeal exudate.  Eyes: Conjunctivae and EOM are normal. Pupils are equal, round, and reactive to light. No scleral icterus.  Neck: Normal range of motion. Neck supple.  Cardiovascular: Normal rate, regular rhythm, normal heart sounds and intact distal pulses.   No murmur heard. Pulmonary/Chest: Effort normal. No respiratory distress. He has wheezes.  Scattered intermittent wheezes bilaterally.   Abdominal: Soft. Bowel sounds are normal. He exhibits no distension and no mass. There is no tenderness. There is no rebound and no guarding.  Musculoskeletal: He exhibits no edema or tenderness.  Lymphadenopathy:    He has no cervical adenopathy.  Neurological: He is alert and oriented to person, place, and time. No cranial nerve deficit.  Skin: Skin is warm and dry.  Psychiatric: He has a normal mood and affect. His behavior is normal.  Nursing note and vitals reviewed.   ED Course  Procedures (including critical care time) Labs Review Labs Reviewed  COMPREHENSIVE METABOLIC PANEL - Abnormal; Notable for the following:    Creatinine, Ser 1.26 (*)    All other components within normal limits  ACETAMINOPHEN LEVEL - Abnormal; Notable for the following:    Acetaminophen (Tylenol), Serum <10 (*)    All other components within normal limits  ETHANOL  SALICYLATE LEVEL  CBC  LIPASE, BLOOD  URINE RAPID DRUG SCREEN, HOSP PERFORMED    Imaging Review No results found. I have personally reviewed and evaluated these images and lab results as  part of my medical decision-making.   EKG Interpretation None      MDM   Final diagnoses:  None    17:55 Will check CMP, CBC, UDS, acteaminophen, salicylate, lipase, EKG. Pt A&Ox3 and cooperative in room. Denies SI/HI and states he does not remember the overdose events but will consult TTS given pt's psych history and substance use. He will need psych eval.   Discussed with TTS. They cleared pt for discharge home. Will have pt sign safety contract and discharge home.   Carlene CoriaSerena Y Tashala Cumbo, PA-C 06/16/15 1936  Eber HongBrian Miller, MD 06/19/15 [phone]

## 2015-06-16 NOTE — BH Assessment (Addendum)
Tele Assessment Note   Eric Bentley is an 39 y.o. male that presented to the ED due to pain in his liver.   Patient denies SI/HI/Psychosis.   Patient reports that he used crack cocaine last week on Thursday and Sunday.  Patient reports that when he used he felt suicidal and took pills in an attempt to die.  Patient reports feeling very upset with himself on Thursday and Sunday for using crack cocaine. Patient reports that he had only used this drug on one other occasion.  Patient reports that he does not remember how much cocaine he used.    Patient insisted that he was only coming to the ED due to his liver hurting.  Patient reports that he wants to live.   Patient denies a history of psychiatric hospitalizations.  Patient reports [facility].  Patient reports receiving medication for his diagnosis of Bipolar Disorder from his Primary Care Physician.      Patient reports that he was drinking every day for a while but now he only drink about 2-3 times a week.  Patient denies withdrawal symptoms.  Patient denies a history of seizures.  Patient denies physical, sexual or emotional abuse.    Diagnosis: Alcohol Abuse and Bipolar   Past Medical History:  Past Medical History  Diagnosis Date  . Hyperlipidemia   . Hypertension   . Depression   . Anxiety   . Unspecified essential hypertension 10/14/2013  . Mixed hyperlipidemia 10/14/2013  . Bipolar 1 disorder, mixed, moderate (HCC) 10/14/2013    History reviewed. No pertinent past surgical history.  Family History:  Family History  Problem Relation Age of Onset  . Hyperlipidemia Mother   . Hypertension Mother   . Heart disease Mother [id number]    fatal  . Early death Mother   . Cancer Sister     breast  . Depression Sister   . Heart disease Brother   . Hyperlipidemia Brother   . Hypertension Brother   . Stroke Brother 28    OBESE 400#  . Heart disease Paternal Grandfather   . Hyperlipidemia Paternal  Grandfather   . Hypertension Paternal Grandfather     Social History:  reports that he has been smoking.  He does not have any smokeless tobacco history on file. He reports that he drinks alcohol. He reports that he does not use illicit drugs.  Additional Social History:  Alcohol / Drug Use History of alcohol / drug use?: Yes Longest period of sobriety (when/how long): Unable to remember  Negative Consequences of Use: Financial, Personal relationships, Work / School Withdrawal Symptoms:  (None Reported) Substance #1 Name of Substance 1: Alcohol  1 - Age of First Use: 14 1 - Amount (size/oz): varies 1 - Frequency: 2-3 weekly 1 - Duration: for the past year 1 - Last Use / Amount: yesterday.   He does not remember how much he drank   CIWA: CIWA-Ar BP: (!) 148/101 mmHg Pulse Rate: 100 COWS:    PATIENT STRENGTHS: (choose at least two) Ability for insight Average or above average intelligence Capable of independent living Psychologist, counsellingCommunication skills Financial means Work skills  Allergies: No Known Allergies  Home Medications:  (Not in a hospital admission)  OB/GYN Status:  No LMP for male patient.  General Assessment Data [facility] T[facility] Assessment: In system Is this a Tele or Face-to-Face Assessment?: Tele Assessment Is this an Initial Assessment or a Re-assessment for this encounter?: Initial Assessment Marital  status: Single Maiden name: NA Is patient pregnant?: No Pregnancy Status: No Living Arrangements: Alone Can pt return to current living arrangement?: Yes Admission Status: Voluntary Is patient capable of signing voluntary admission?: Yes Referral Source: Self/Family/Friend Insurance type: Scientist, research (physical sciences) Exam [facility] Walk-in ONLY) Medical Exam completed: Yes  Crisis Care Plan Living Arrangements: Alone Name of Psychiatrist: None Reported Name of Therapist: None Reported  Education Status Is patient currently in school?: No Current  Grade: NA Highest grade of school patient has completed: NA Name of school: NA Contact person: NA  Risk to self with the past 6 months Suicidal Ideation: No Has patient been a risk to self within the past 6 months prior to admission? : No Suicidal Intent: No Has patient had any suicidal intent within the past 6 months prior to admission? : No Is patient at risk for suicide?: No Suicidal Plan?: No Has patient had any suicidal plan within the past 6 months prior to admission? : No Access to Means: No What has been your use of drugs/alcohol within the last 12 months?: Alcohol Previous Attempts/Gestures: No How many times?: 0 Other Self Harm Risks: None Reported Triggers for Past Attempts: None known Intentional Self Injurious Behavior: None Family Suicide History: No Recent stressful life event(s): Other (Comment) (Unable to stop drinking ) Persecutory voices/beliefs?: No Depression: Yes Depression Symptoms:  (None Reported) Substance abuse history and/or treatment for substance abuse?: Yes Suicide prevention information given to non-admitted patients: Yes  Risk to Others within the past 6 months Homicidal Ideation: No Does patient have any lifetime risk of violence toward others beyond the six months prior to admission? : No Thoughts of Harm to Others: No Current Homicidal Intent: No Current Homicidal Plan: No Access to Homicidal Means: No Identified Victim: None Reported History of harm to others?: No Assessment of Violence: None Noted Violent Behavior Description: NA Does patient have access to weapons?: No Criminal Charges Pending?: No Does patient have a court date: No Is patient on probation?: No  Psychosis Hallucinations: None noted Delusions: None noted  Mental Status Report Appearance/Hygiene: Disheveled Eye Contact: Good Motor Activity: Freedom of movement Speech: Logical/coherent Level of Consciousness: Alert Mood: Pleasant Affect: Appropriate to  circumstance Anxiety Level: None Thought Processes: Coherent, Relevant Judgement: Unimpaired Orientation: Person, Place, Time, Situation Obsessive Compulsive Thoughts/Behaviors: None  Cognitive Functioning Concentration: Normal Memory: Recent Intact, Remote Intact IQ: Average Insight: Fair Impulse Control: Fair Appetite: Good Weight Loss: 0 Weight Gain: 0 Sleep: No Change Total Hours of Sleep: 9 Vegetative Symptoms: None  ADLScreening [facility] Assessment Services) Patient's cognitive ability adequate to safely complete daily activities?: Yes Patient able to express need for assistance with ADLs?: Yes Independently performs ADLs?: Yes (appropriate for developmental age)  Prior Inpatient Therapy Prior Inpatient Therapy: Yes Prior Therapy Dates: 2015 Prior Therapy Facilty/Provider(s): [facility]  Reason for Treatment: Detox  Prior Outpatient Therapy Prior Outpatient Therapy: No Prior Therapy Dates: NA Prior Therapy Facilty/Provider(s): NA Reason for Treatment: NA Does patient have an ACCT team?: No Does patient have Intensive In-House Services?  : No Does patient have [facility] services? : No Does patient have P4CC services?: No  ADL Screening (condition at time of admission) Patient's cognitive ability adequate to safely complete daily activities?: Yes Is the patient deaf or have difficulty hearing?: No Does the patient have difficulty seeing, even when wearing glasses/contacts?: No Does the patient have difficulty concentrating, remembering, or making decisions?: No Patient able to express need for assistance with ADLs?: Yes Does the patient  have difficulty dressing or bathing?: No Independently performs ADLs?: Yes (appropriate for developmental age) Does the patient have difficulty walking or climbing stairs?: No Weakness of Legs: None Weakness of Arms/Hands: None  Home Assistive Devices/Equipment Home Assistive Devices/Equipment: None    Abuse/Neglect Assessment  (Assessment to be complete while patient is alone) Physical Abuse: Denies Verbal Abuse: Denies Sexual Abuse: Denies Exploitation of patient/patient's resources: Denies Self-Neglect: Denies Values / Beliefs Cultural Requests During Hospitalization: None Spiritual Requests During Hospitalization: None Consults Spiritual Care Consult Needed: No Social Work Consult Needed: No Merchant navy officer (For Healthcare) Does patient have an advance directive?: No Would patient like information on creating an advanced directive?: No - patient declined information    Additional Information 1:1 In Past 12 Months?: No CIRT Risk: No Elopement Risk: No Does patient have medical clearance?: Yes     Disposition: Per Dr. Lolly Mustache, patient does not meet criteria for inpatient hospitalization.   Writer informed the PA of the disposition.  Writer faxed a no harm contract to the PA for the patient.   Disposition Initial Assessment Completed for this Encounter: Yes Disposition of Patient: Other dispositions  Linton Rump 06/16/2015 6:33 PM

## 2015-06-16 NOTE — Discharge Instructions (Signed)
[facility] discussed. Return to the ED if you have any thoughts of harming yourself or others.   Please obtain all of your results from medical records or have your doctors office obtain the results - share them with your doctor - you should be seen at your doctors office in the next 2 days. Call today to arrange your follow up. Take the medications as prescribed. Please review all of the medicines and only take them if you do not have an allergy to them. Please be aware that if you are taking birth control pills, taking other prescriptions, ESPECIALLY ANTIBIOTICS may make the birth control ineffective - if this is the case, either do not engage in sexual activity or use alternative methods of birth control such as condoms until you have finished the medicine and your family doctor says it is OK to restart them. If you are on a blood thinner such as COUMADIN, be aware that any other medicine that you take may cause the coumadin to either work too much, or not enough - you should have your coumadin level rechecked in next 7 days if this is the case.  ?  It is also a possibility that you have an allergic reaction to any of the medicines that you have been prescribed - Everybody reacts differently to medications and while MOST people have no trouble with most medicines, you may have a reaction such as nausea, vomiting, rash, swelling, shortness of breath. If this is the case, please stop taking the medicine immediately and contact your physician.  ?  You should return to the ER if you develop severe or worsening symptoms.

## 2015-06-23 ENCOUNTER — Encounter: Payer: Self-pay | Admitting: Internal Medicine

## 2015-06-23 ENCOUNTER — Ambulatory Visit (INDEPENDENT_AMBULATORY_CARE_PROVIDER_SITE_OTHER): Payer: BLUE CROSS/BLUE SHIELD | Admitting: Internal Medicine

## 2015-06-23 VITALS — BP 126/80 | HR 88 | Temp 98.2°F | Resp 18

## 2015-06-23 DIAGNOSIS — E781 Pure hyperglyceridemia: Secondary | ICD-10-CM | POA: Diagnosis not present

## 2015-06-23 DIAGNOSIS — J069 Acute upper respiratory infection, unspecified: Secondary | ICD-10-CM | POA: Diagnosis not present

## 2015-06-23 DIAGNOSIS — I10 Essential (primary) hypertension: Secondary | ICD-10-CM

## 2015-06-23 DIAGNOSIS — Z79899 Other long term (current) drug therapy: Secondary | ICD-10-CM

## 2015-06-23 LAB — CBC WITH DIFFERENTIAL/PLATELET
BASOS ABS: 0 10*3/uL (ref 0.0–0.1)
Basophils Relative: 0 % (ref 0–1)
Eosinophils Absolute: 0.2 10*3/uL (ref 0.0–0.7)
Eosinophils Relative: 2 % (ref 0–5)
HEMATOCRIT: 46 % (ref 39.0–52.0)
HEMOGLOBIN: 16.1 g/dL (ref 13.0–17.0)
LYMPHS ABS: 2 10*3/uL (ref 0.7–4.0)
LYMPHS PCT: 20 % (ref 12–46)
MCH: 32.4 pg (ref 26.0–34.0)
MCHC: 35 g/dL (ref 30.0–36.0)
MCV: 92.6 fL (ref 78.0–100.0)
MPV: 10.2 fL (ref 8.6–12.4)
Monocytes Absolute: 0.6 10*3/uL (ref 0.1–1.0)
Monocytes Relative: 6 % (ref 3–12)
NEUTROS ABS: 7.2 10*3/uL (ref 1.7–7.7)
NEUTROS PCT: 72 % (ref 43–77)
PLATELETS: 268 10*3/uL (ref 150–400)
RBC: 4.97 MIL/uL (ref 4.22–5.81)
RDW: 11.8 % (ref 11.5–15.5)
WBC: 10 10*3/uL (ref 4.0–10.5)

## 2015-06-23 LAB — LIPID PANEL
Cholesterol: 198 mg/dL (ref 125–200)
HDL: 49 mg/dL (ref >=40)
LDL CALC: 105 mg/dL (ref <130)
Total CHOL/HDL Ratio: 4 Ratio (ref <=5.0)
Triglycerides: 219 mg/dL — ABNORMAL HIGH (ref <150)
VLDL: 44 mg/dL — ABNORMAL HIGH (ref <30)

## 2015-06-23 LAB — BASIC METABOLIC PANEL WITH GFR
BUN: 12 mg/dL (ref 7–25)
CHLORIDE: 105 mmol/L (ref 98–110)
CO2: 25 mmol/L (ref 20–31)
Calcium: 9.7 mg/dL (ref 8.6–10.3)
Creat: 1 mg/dL (ref 0.60–1.35)
GFR, Est African American: >89 mL/min (ref >=60)
GFR, Est Non African American: >89 mL/min (ref >=60)
GLUCOSE: 93 mg/dL (ref 65–99)
POTASSIUM: 4.4 mmol/L (ref 3.5–5.3)
Sodium: 140 mmol/L (ref 135–146)

## 2015-06-23 LAB — HEPATIC FUNCTION PANEL
ALBUMIN: 4.4 g/dL (ref 3.6–5.1)
ALK PHOS: 64 U/L (ref 40–115)
ALT: 15 U/L (ref 9–46)
AST: 14 U/L (ref 10–40)
BILIRUBIN TOTAL: 0.5 mg/dL (ref 0.2–1.2)
Bilirubin, Direct: 0.1 mg/dL (ref <=0.2)
Indirect Bilirubin: 0.4 mg/dL (ref 0.2–1.2)
Total Protein: 6.8 g/dL (ref 6.1–8.1)

## 2015-06-23 MED ORDER — LISINOPRIL 20 MG PO TABS: 20.0000 mg | ORAL_TABLET | Freq: Every day | ORAL | Status: DC

## 2015-06-23 MED ORDER — AZITHROMYCIN 250 MG PO TABS: ORAL_TABLET | ORAL | Status: DC

## 2015-06-23 MED ORDER — BENZONATATE 200 MG PO CAPS: 200.0000 mg | ORAL_CAPSULE | Freq: Three times a day (TID) | ORAL | Status: DC | PRN

## 2015-06-23 MED ORDER — PREDNISONE 20 MG PO TABS: ORAL_TABLET | ORAL | Status: DC

## 2015-06-23 MED ORDER — FENOFIBRATE 145 MG PO TABS: 145.0000 mg | ORAL_TABLET | Freq: Every day | ORAL | Status: DC

## 2015-06-23 NOTE — Patient Instructions (Signed)
Please take the zpak until it is all the way gone.  Please take the prednisone until it is all the way gone.  Please take tessalon or robitussin as needed for coughing.  Please make sure that you are [facility].    Please make sure that you take both your fenofibrate and your cholesterol medication.

## 2015-06-23 NOTE — Progress Notes (Signed)
Patient ID: Eric Bentley, male   DOB: 1975/12/10, 39 y.o.   MRN: [id number]  HPI  Patient presents to the office for evaluation of cough.  It has been going on for 3 days.  Patient reports all the time, wet, barky, he is bringing up yellow green mucuos.  They also endorse postnasal drip, sputum production and runny nose, chest congestion, and nasal congestion.  He reports yellow nasal sputum.  .  They have tried antitussives and halls cough drops.  They report that nothing has worked.  They denies other sick contacts.    Review of Systems  Constitutional: Negative for fever, chills and malaise/fatigue.  HENT: Positive for congestion and ear pain. Negative for sore throat.   Eyes: Positive for discharge (watery eyes).  Respiratory: Positive for cough and sputum production. Negative for shortness of breath and wheezing.   Cardiovascular: Negative for chest pain, palpitations and leg swelling.  Neurological: Negative for headaches.    PE:  Filed Vitals:   06/23/15 0929  BP: 126/80  Pulse: 88  Temp: 98.2 F (36.8 C)  Resp: 18   General:  Alert and non-toxic, WDWN, NAD HEENT: NCAT, PERLA, EOM normal, no occular discharge or erythema.  Nasal mucosal edema with sinus tenderness to palpation.  Oropharynx clear with minimal oropharyngeal edema and erythema.  Mucous membranes moist and pink. Neck:  Cervical adenopathy Chest:  RRR no MRGs.  Lungs clear to auscultation A&P with no wheezes rhonchi or rales.   Abdomen: +BS x 4 quadrants, soft, non-tender, no guarding, rigidity, or rebound. Skin: warm and dry no rash Neuro: A&Ox4, CN II-XII grossly intact  Assessment and Plan:   1. Hypertriglyceridemia  - fenofibrate (TRICOR) 145 MG tablet; Take 1 tablet (145 mg total) by mouth daily.  Dispense: 30 tablet; Refill: 11 - Lipid panel  2. Essential hypertension  - lisinopril (PRINIVIL,ZESTRIL) 20 MG tablet; Take 1 tablet (20 mg total) by mouth daily.  Dispense: 90 tablet; Refill: 0  3.  Medication management  - CBC with Differential/Platelet - BASIC METABOLIC PANEL WITH GFR - Hepatic function panel  4.  Acute URI -tessalon -prednisone -zpak  5.  Bipolar -encouraged to follow with psych

## 2015-07-01 ENCOUNTER — Emergency Department (HOSPITAL_COMMUNITY)
Admission: EM | Admit: 2015-07-01 | Discharge: 2015-07-01 | Disposition: A | Discharge status: Home / Self Care | Payer: BLUE CROSS/BLUE SHIELD | Attending: Emergency Medicine | Admitting: Emergency Medicine

## 2015-07-01 ENCOUNTER — Encounter (HOSPITAL_COMMUNITY): Payer: Self-pay | Admitting: Emergency Medicine

## 2015-07-01 DIAGNOSIS — Z720 Tobacco use: Secondary | ICD-10-CM | POA: Diagnosis not present

## 2015-07-01 DIAGNOSIS — F3162 Bipolar disorder, current episode mixed, moderate: Secondary | ICD-10-CM | POA: Insufficient documentation

## 2015-07-01 DIAGNOSIS — R45851 Suicidal ideations: Secondary | ICD-10-CM | POA: Diagnosis present

## 2015-07-01 DIAGNOSIS — F1210 Cannabis abuse, uncomplicated: Secondary | ICD-10-CM | POA: Insufficient documentation

## 2015-07-01 DIAGNOSIS — E782 Mixed hyperlipidemia: Secondary | ICD-10-CM | POA: Insufficient documentation

## 2015-07-01 DIAGNOSIS — F419 Anxiety disorder, unspecified: Secondary | ICD-10-CM | POA: Diagnosis not present

## 2015-07-01 DIAGNOSIS — F1410 Cocaine abuse, uncomplicated: Secondary | ICD-10-CM | POA: Diagnosis not present

## 2015-07-01 DIAGNOSIS — I10 Essential (primary) hypertension: Secondary | ICD-10-CM | POA: Insufficient documentation

## 2015-07-01 DIAGNOSIS — E785 Hyperlipidemia, unspecified: Secondary | ICD-10-CM | POA: Diagnosis not present

## 2015-07-01 DIAGNOSIS — F1094 Alcohol use, unspecified with alcohol-induced mood disorder: Secondary | ICD-10-CM | POA: Diagnosis not present

## 2015-07-01 DIAGNOSIS — F1010 Alcohol abuse, uncomplicated: Secondary | ICD-10-CM | POA: Diagnosis not present

## 2015-07-01 DIAGNOSIS — F1014 Alcohol abuse with alcohol-induced mood disorder: Secondary | ICD-10-CM | POA: Insufficient documentation

## 2015-07-01 DIAGNOSIS — Z79899 Other long term (current) drug therapy: Secondary | ICD-10-CM | POA: Diagnosis not present

## 2015-07-01 LAB — COMPREHENSIVE METABOLIC PANEL
ALBUMIN: 4.7 g/dL (ref 3.5–5.0)
ALK PHOS: 47 U/L (ref 38–126)
ALT: 18 U/L (ref 17–63)
ANION GAP: 10 (ref 5–15)
AST: 15 U/L (ref 15–41)
BILIRUBIN TOTAL: 0.5 mg/dL (ref 0.3–1.2)
BUN: 12 mg/dL (ref 6–20)
CALCIUM: 9.7 mg/dL (ref 8.9–10.3)
CO2: 23 mmol/L (ref 22–32)
CREATININE: 1.04 mg/dL (ref 0.61–1.24)
Chloride: 112 mmol/L — ABNORMAL HIGH (ref 101–111)
GFR calc Af Amer: >60 mL/min (ref >60)
GFR calc non Af Amer: >60 mL/min (ref >60)
GLUCOSE: 98 mg/dL (ref 65–99)
Potassium: 4.1 mmol/L (ref 3.5–5.1)
SODIUM: 145 mmol/L (ref 135–145)
TOTAL PROTEIN: 7.7 g/dL (ref 6.5–8.1)

## 2015-07-01 LAB — RAPID URINE DRUG SCREEN, HOSP PERFORMED
AMPHETAMINES: NOT DETECTED
Barbiturates: NOT DETECTED
Benzodiazepines: NOT DETECTED
COCAINE: POSITIVE — AB
Opiates: NOT DETECTED
TETRAHYDROCANNABINOL: POSITIVE — AB

## 2015-07-01 LAB — CBC
HEMATOCRIT: 49.3 % (ref 39.0–52.0)
HEMOGLOBIN: 17.3 g/dL — AB (ref 13.0–17.0)
MCH: 32.3 pg (ref 26.0–34.0)
MCHC: 35.1 g/dL (ref 30.0–36.0)
MCV: 92.1 fL (ref 78.0–100.0)
Platelets: 337 10*3/uL (ref 150–400)
RBC: 5.35 MIL/uL (ref 4.22–5.81)
RDW: 11.9 % (ref 11.5–15.5)
WBC: 14.3 10*3/uL — ABNORMAL HIGH (ref 4.0–10.5)

## 2015-07-01 LAB — ETHANOL: Alcohol, Ethyl (B): 204 mg/dL — ABNORMAL HIGH (ref <5)

## 2015-07-01 LAB — ACETAMINOPHEN LEVEL: Acetaminophen (Tylenol), Serum: <10 ug/mL — ABNORMAL LOW (ref 10–30)

## 2015-07-01 LAB — SALICYLATE LEVEL: Salicylate Lvl: <4 mg/dL (ref 2.8–30.0)

## 2015-07-01 MED ORDER — ALUM & MAG HYDROXIDE-SIMETH 200-200-20 MG/5ML PO SUSP: 30.0000 mL | ORAL | Status: DC | PRN

## 2015-07-01 MED ORDER — ONDANSETRON HCL 4 MG PO TABS: 4.0000 mg | ORAL_TABLET | Freq: Three times a day (TID) | ORAL | Status: DC | PRN

## 2015-07-01 MED ORDER — PRAVASTATIN SODIUM 40 MG PO TABS
40.0000 mg | ORAL_TABLET | Freq: Every evening | ORAL | Status: DC
  Filled 2015-07-01: qty 1

## 2015-07-01 MED ORDER — IBUPROFEN 200 MG PO TABS: 600.0000 mg | ORAL_TABLET | Freq: Three times a day (TID) | ORAL | Status: DC | PRN

## 2015-07-01 MED ORDER — FENOFIBRATE 160 MG PO TABS
160.0000 mg | ORAL_TABLET | Freq: Every day | ORAL | Status: DC
  Administered 2015-07-01: 160 mg via ORAL
  Filled 2015-07-01: qty 1

## 2015-07-01 MED ORDER — ZOLPIDEM TARTRATE 5 MG PO TABS: 5.0000 mg | ORAL_TABLET | Freq: Every evening | ORAL | Status: DC | PRN

## 2015-07-01 MED ORDER — ACETAMINOPHEN 325 MG PO TABS: 650.0000 mg | ORAL_TABLET | ORAL | Status: DC | PRN

## 2015-07-01 MED ORDER — LAMOTRIGINE 25 MG PO TABS
25.0000 mg | ORAL_TABLET | Freq: Two times a day (BID) | ORAL | Status: DC
  Administered 2015-07-01: 25 mg via ORAL
  Filled 2015-07-01 (×2): qty 1

## 2015-07-01 MED ORDER — LORAZEPAM 1 MG PO TABS: 0.0000 mg | ORAL_TABLET | Freq: Two times a day (BID) | ORAL | Status: DC

## 2015-07-01 MED ORDER — LORAZEPAM 1 MG PO TABS: 0.0000 mg | ORAL_TABLET | Freq: Four times a day (QID) | ORAL | Status: DC

## 2015-07-01 MED ORDER — LISINOPRIL 20 MG PO TABS
20.0000 mg | ORAL_TABLET | Freq: Every day | ORAL | Status: DC
  Administered 2015-07-01: 20 mg via ORAL
  Filled 2015-07-01: qty 1

## 2015-07-01 MED ORDER — NICOTINE 21 MG/24HR TD PT24
21.0000 mg | MEDICATED_PATCH | Freq: Every day | TRANSDERMAL | Status: DC
  Administered 2015-07-01 (×2): 21 mg via TRANSDERMAL
  Filled 2015-07-01 (×2): qty 1

## 2015-07-01 NOTE — Consult Note (Signed)
[place] Psychiatry Consult   Reason for Consult:  Suicidal ideations Referring Physician:  EDP Patient Identification: Dionysios Massman MRN:  [id number] Principal Diagnosis: Alcohol-induced mood disorder [facility]) Diagnosis:   Patient Active Problem List   Diagnosis Date Noted  . Alcohol abuse [F10.10] 07/01/2015    Priority: High  . Cocaine abuse [F14.10] 07/01/2015    Priority: High  . Alcohol-induced mood disorder ([place]) [F10.94] 07/01/2015    Priority: High  . Prediabetes [R73.03] 12/08/2013  . Chronic anxiety [F41.9] 12/08/2013  . Vitamin D deficiency [E55.9] 11/16/2013  . Medication management [Z79.899] 11/16/2013  . Essential hypertension [I10] 10/14/2013  . Hyperlipidemia [E78.2] 10/14/2013  . Bipolar 1 disorder, mixed, moderate ([place]) [F31.62] 10/14/2013    Total Time spent with patient: 45 minutes  Subjective:   Rei Medlen is a 39 y.o. male patient does not warrant admission.  HPI:  39 yo male who presented to the ED with intoxication, depression, and suicidal ideations.  Hamzeh had been drinking and using cocaine and started having suicidal ideations.  Today, he denies suicidal/homicidal ideations, hallucinations, and withdrawal symptoms.  He wants outpatient assistance, appointment made at [facility] for him to see a psychiatrist.  Stable for discharge.  Past Psychiatric History: Substance abuse, depression  Risk to Self: Suicidal Ideation: Yes-Currently Present Suicidal Intent: Yes-Currently Present Is patient at risk for suicide?: Yes Suicidal Plan?: No Access to Means: No What has been your use of drugs/alcohol within the last 12 months?: ETOH use How many times?: 0 Other Self Harm Risks: None reported Triggers for Past Attempts: None known Intentional Self Injurious Behavior: None Risk to Others: Homicidal Ideation: Yes-Currently Present Thoughts of Harm to Others: Yes-Currently Present Comment - Thoughts of Harm to Others: Conflicted over whether to kill  self or others. Current Homicidal Intent: No Current Homicidal Plan: No Access to Homicidal Means: No Identified Victim:  (Unknown) History of harm to others?: No Assessment of Violence: None Noted Violent Behavior Description: Unknown Does patient have access to weapons?: No Criminal Charges Pending?: No Does patient have a court date: No Prior Inpatient Therapy: Prior Inpatient Therapy: Yes Prior Therapy Dates: 2015 Prior Therapy Facilty/Provider(s): [facility]  Reason for Treatment: Detox Prior Outpatient Therapy: Prior Outpatient Therapy: No Prior Therapy Dates:  (Unable to assess) Prior Therapy Facilty/Provider(s):  (Unknown) Reason for Treatment:  (Unable to assess) Does patient have an ACCT team?: Unknown Does patient have Intensive In-House Services?  : Unknown Does patient have [facility] services? : Unknown Does patient have P4CC services?: Unknown  Past Medical History:  Past Medical History  Diagnosis Date  . Hyperlipidemia   . Hypertension   . Depression   . Anxiety   . Unspecified essential hypertension 10/14/2013  . Mixed hyperlipidemia 10/14/2013  . Bipolar 1 disorder, mixed, moderate ([place]) 10/14/2013   History reviewed. No pertinent past surgical history. Family History:  Family History  Problem Relation Age of Onset  . Hyperlipidemia Mother   . Hypertension Mother   . Heart disease Mother 22    fatal  . Early death Mother   . Cancer Sister     breast  . Depression Sister   . Heart disease Brother   . Hyperlipidemia Brother   . Hypertension Brother   . Stroke Brother 28    OBESE 400#  . Heart disease Paternal Grandfather   . Hyperlipidemia Paternal Grandfather   . Hypertension Paternal Grandfather    Family Psychiatric  History: Substance abuse Social History:  History  Alcohol Use  . 0.0  oz/week  . 0 [place] drinks or equivalent per week    Comment: occasional     History  Drug Use  . Yes  . Special: Cocaine, Marijuana    Comment:  crack    Social History   Social History  . Marital Status: Single    Spouse Name: N/A  . Number of Children: N/A  . Years of Education: N/A   Social History Main Topics  . Smoking status: Current Every Day Smoker -- 0.50 packs/day for 15 years  . Smokeless tobacco: None  . Alcohol Use: 0.0 oz/week    0 [place] drinks or equivalent per week     Comment: occasional  . Drug Use: Yes    Special: Cocaine, Marijuana     Comment: crack  . Sexual Activity: Not Asked   Other Topics Concern  . None   Social History Narrative   Additional Social History:    Pain Medications: See PTA medication list Prescriptions: See PTA medication list Over the Counter: See PTA medication list History of alcohol / drug use?: Yes Name of Substance 1: ETOH 1 - Age of First Use: 39 years of age 66 - Amount (size/oz): Varies 1 - Frequency: 2-3 times in a week 1 - Duration: for the past year 1 - Last Use / Amount: 11/03  Pt angrily said the amount did not matter. Name of Substance 2: Cocaine 2 - Age of First Use: Unknown 2 - Amount (size/oz): Varies 2 - Frequency: According to finances, will vary 2 - Duration: Claims to have only used it once before 2 - Last Use / Amount: Unknown                 Allergies:  No Known Allergies  Labs:  Results for orders placed or performed during the hospital encounter of 07/01/15 (from the past 48 hour(s))  Comprehensive metabolic panel     Status: Abnormal   Collection Time: 07/01/15  4:09 AM  Result Value Ref Range   Sodium 145 135 - 145 mmol/L   Potassium 4.1 3.5 - 5.1 mmol/L   Chloride 112 (H) 101 - 111 mmol/L   CO2 23 22 - 32 mmol/L   Glucose, Bld 98 65 - 99 mg/dL   BUN 12 6 - 20 mg/dL   Creatinine, Ser 1.04 0.61 - 1.24 mg/dL   Calcium 9.7 8.9 - 10.3 mg/dL   Total Protein 7.7 6.5 - 8.1 g/dL   Albumin 4.7 3.5 - 5.0 g/dL   AST 15 15 - 41 U/L   ALT 18 17 - 63 U/L   Alkaline Phosphatase 47 38 - 126 U/L   Total Bilirubin 0.5 0.3 - 1.2 mg/dL    GFR calc non Af Amer >60 >60 mL/min   GFR calc Af Amer >60 >60 mL/min    Comment: (NOTE) The eGFR has been calculated using the CKD EPI equation. This calculation has not been validated in all clinical situations. eGFR's persistently <60 mL/min signify possible Chronic Kidney Disease.    Anion gap 10 5 - 15  Ethanol (ETOH)     Status: Abnormal   Collection Time: 07/01/15  4:09 AM  Result Value Ref Range   Alcohol, Ethyl (B) 204 (H) <5 mg/dL    Comment:        LOWEST DETECTABLE LIMIT FOR SERUM ALCOHOL IS 5 mg/dL FOR MEDICAL PURPOSES ONLY   Salicylate level     Status: None   Collection Time: 07/01/15  4:09 AM  Result Value  Ref Range   Salicylate Lvl <6.5 2.8 - 30.0 mg/dL  Acetaminophen level     Status: Abnormal   Collection Time: 07/01/15  4:09 AM  Result Value Ref Range   Acetaminophen (Tylenol), Serum <10 (L) 10 - 30 ug/mL    Comment:        THERAPEUTIC CONCENTRATIONS VARY SIGNIFICANTLY. A RANGE OF 10-30 ug/mL MAY BE AN EFFECTIVE CONCENTRATION FOR MANY PATIENTS. HOWEVER, SOME ARE BEST TREATED AT CONCENTRATIONS OUTSIDE THIS RANGE. ACETAMINOPHEN CONCENTRATIONS >150 ug/mL AT 4 HOURS AFTER INGESTION AND >50 ug/mL AT 12 HOURS AFTER INGESTION ARE OFTEN ASSOCIATED WITH TOXIC REACTIONS.   CBC     Status: Abnormal   Collection Time: 07/01/15  4:09 AM  Result Value Ref Range   WBC 14.3 (H) 4.0 - 10.5 K/uL   RBC 5.35 4.22 - 5.81 MIL/uL   Hemoglobin 17.3 (H) 13.0 - 17.0 g/dL   HCT 49.3 39.0 - 52.0 %   MCV 92.1 78.0 - 100.0 fL   MCH 32.3 26.0 - 34.0 pg   MCHC 35.1 30.0 - 36.0 g/dL   RDW 11.9 11.5 - 15.5 %   Platelets 337 150 - 400 K/uL  Urine rapid drug screen (hosp performed) (Not at [facility])     Status: Abnormal   Collection Time: 07/01/15  4:45 AM  Result Value Ref Range   Opiates NONE DETECTED NONE DETECTED   Cocaine POSITIVE (A) NONE DETECTED   Benzodiazepines NONE DETECTED NONE DETECTED   Amphetamines NONE DETECTED NONE DETECTED   Tetrahydrocannabinol POSITIVE  (A) NONE DETECTED   Barbiturates NONE DETECTED NONE DETECTED    Comment:        DRUG SCREEN FOR MEDICAL PURPOSES ONLY.  IF CONFIRMATION IS NEEDED FOR ANY PURPOSE, NOTIFY LAB WITHIN 5 DAYS.        LOWEST DETECTABLE LIMITS FOR URINE DRUG SCREEN Drug Class       Cutoff (ng/mL) Amphetamine      1000 Barbiturate      200 Benzodiazepine   784 Tricyclics       696 Opiates          300 Cocaine          300 THC              50     Current Facility-Administered Medications  Medication Dose Route Frequency Provider Last Rate Last Dose  . acetaminophen (TYLENOL) tablet 650 mg  650 mg Oral Q4H PRN Domenic Moras, PA-C      . alum & mag hydroxide-simeth (MAALOX/MYLANTA) 200-200-20 MG/5ML suspension 30 mL  30 mL Oral PRN Domenic Moras, PA-C      . fenofibrate tablet 160 mg  160 mg Oral Daily Domenic Moras, PA-C   160 mg at 07/01/15 1030  . ibuprofen (ADVIL,MOTRIN) tablet 600 mg  600 mg Oral Q8H PRN Domenic Moras, PA-C      . lamoTRIgine (LAMICTAL) tablet 25 mg  25 mg Oral BID Domenic Moras, PA-C   25 mg at 07/01/15 1030  . lisinopril (PRINIVIL,ZESTRIL) tablet 20 mg  20 mg Oral Daily Domenic Moras, PA-C   20 mg at 07/01/15 1030  . LORazepam (ATIVAN) tablet 0-4 mg  0-4 mg Oral 4 times per day Domenic Moras, PA-C   0 mg at 07/01/15 2952   Followed by  . [START ON 07/03/2015] LORazepam (ATIVAN) tablet 0-4 mg  0-4 mg Oral Q12H Domenic Moras, PA-C      . nicotine (NICODERM CQ - dosed in mg/24 hours) patch 21 mg  21 mg Transdermal Daily Domenic Moras, PA-C   21 mg at 07/01/15 1033  . ondansetron (ZOFRAN) tablet 4 mg  4 mg Oral Q8H PRN Domenic Moras, PA-C      . pravastatin (PRAVACHOL) tablet 40 mg  40 mg Oral QPM Domenic Moras, PA-C      . zolpidem (AMBIEN) tablet 5 mg  5 mg Oral QHS PRN Domenic Moras, PA-C       Current Outpatient Prescriptions  Medication Sig Dispense Refill  . benzonatate (TESSALON) 200 MG capsule Take 1 capsule (200 mg total) by mouth 3 (three) times daily as needed for cough. 30 capsule 1  . fenofibrate (TRICOR)  145 MG tablet Take 1 tablet (145 mg total) by mouth daily. 30 tablet 11  . lamoTRIgine (LAMICTAL) 25 MG tablet Take 1 tablet (25 mg total) by mouth 2 (two) times daily. 90 tablet 0  . lisinopril (PRINIVIL,ZESTRIL) 20 MG tablet Take 1 tablet (20 mg total) by mouth daily. 90 tablet 0  . meloxicam (MOBIC) 15 MG tablet Take 1 tablet (15 mg total) by mouth daily. (Patient taking differently: Take 15 mg by mouth daily as needed (for back pain.). ) 30 tablet 2  . Omega-3 Fatty Acids (FISH OIL) 1000 MG CAPS Take 1,000 mg by mouth daily.    . pravastatin (PRAVACHOL) 40 MG tablet Take 1 tablet (40 mg total) by mouth every evening. 30 tablet 11  . tiZANidine (ZANAFLEX) 4 MG tablet Take 1 tablet (4 mg total) by mouth every 8 (eight) hours as needed for muscle spasms. (Patient taking differently: Take 4 mg by mouth every 8 (eight) hours as needed for muscle spasms. For muscle spasms) 90 tablet 1  . azithromycin (ZITHROMAX Z-PAK) 250 MG tablet 2 po day one, then 1 daily x 4 days (Patient taking differently: Take 250-500 mg by mouth as directed. 2 po day one, then 1 daily x 4 days) 5 tablet 0  . LORazepam (ATIVAN) 1 MG tablet Take 1 tablet (1 mg total) by mouth 2 (two) times daily. (Patient not taking: Reported on 07/01/2015) 60 tablet 0  . predniSONE (DELTASONE) 20 MG tablet 3 tabs po day one, then 2 tabs daily x 4 days (Patient not taking: Reported on 07/01/2015) 11 tablet 0    Musculoskeletal: Strength & Muscle Tone: within normal limits Gait & Station: normal Patient leans: N/A  Psychiatric Specialty Exam: Review of Systems  Constitutional: Negative.   HENT: Negative.   Eyes: Negative.   Respiratory: Negative.   Cardiovascular: Negative.   Gastrointestinal: Negative.   Genitourinary: Negative.   Musculoskeletal: Negative.   Skin: Negative.   Neurological: Negative.   Endo/Heme/Allergies: Negative.   Psychiatric/Behavioral: Positive for depression and substance abuse.    Blood pressure 121/85,  pulse 104, temperature 98.5 F (36.9 C), temperature source Oral, resp. rate 18, SpO2 100 %.There is no weight on file to calculate BMI.  General Appearance: Casual  Eye Contact::  Good  Speech:  Normal Rate  Volume:  Normal  Mood:  Irritable  Affect:  Congruent  Thought Process:  Coherent  Orientation:  Full (Time, Place, and Person)  Thought Content:  WDL  Suicidal Thoughts:  No  Homicidal Thoughts:  No  Memory:  Immediate;   Good Recent;   Good Remote;   Good  Judgement:  Fair  Insight:  Fair  Psychomotor Activity:  Normal  Concentration:  Good  Recall:  Good  Fund of Knowledge:Good  Language: Good  Akathisia:  No  Handed:  Right  AIMS (if indicated):     Assets:  Leisure Time Physical Health Resilience  ADL's:  Intact  Cognition: WNL  Sleep:      Treatment Plan Summary: Daily contact with patient to assess and evaluate symptoms and progress in treatment, Medication management and Plan Alcohol induced mood disorder  -Crisis stabilization -Medication management:  Ativan alcohol detox protocol started -Individual and substance abuse counseling -Discharge with resources for   Disposition: No evidence of imminent risk to self or others at present.    Waylan Boga, [place] 07/01/2015 10:54 AM Patient seen face-to-face for psychiatric evaluation, chart reviewed and case discussed with the physician extender and developed treatment plan. Reviewed the information documented and agree with the treatment plan. Corena Pilgrim, MD

## 2015-07-01 NOTE — Discharge Instructions (Addendum)
For your ongoing behavioral health needs you have been scheduled for an intake appointment at [facility] [place].  Your appointment is scheduled for Thursday, August 12, 2015 at 8:00 am with Kathryne SharperSyed Arfeen, MD:       [facility]      [address]      [place], [place] [address]      [phone]

## 2015-07-01 NOTE — BH Assessment (Signed)
[facility] Assessment Progress Note  Per Thedore MinsMojeed Akintayo, MD, this pt does not require psychiatric hospitalization at this time. [facility]. This Clinical research associatewriter spoke to the pt. He is interested in having an appointment scheduled for him at the [facility] [place]. At 11:11 I spoke to SomaliaSylvia at the Outpatient Clinic. Pt is scheduled for an intake appointment with Kathryne SharperSyed Arfeen, MD on Thursday, August 12, 2015 at 08;00. This has been included in pt's discharge instructions. Pt's nurse, Dawnaly, has been notified.  Doylene Canninghomas Felisia Balcom, MA Triage Specialist [phone]

## 2015-07-01 NOTE — BH Assessment (Addendum)
Tele Assessment Note   Eric Bentley is an 39 y.o. male.  -Clinician reviewed note by Fayrene Helper, PA.  Pt had called [facility] because he was having thoughts of killing himself or others.  He has a history of bipolar, anxiety, depression, recurrent suicidal ideation and presents for evaluation of suicidal ideation. Nursing note report pt was transported from home by [facility] after calling for suicidal thoughts. When asked if he is suicidal pt admits he has been for the past 7 years, and admits to attempted SI with OD several times recently without success. He doesn't get good sleep and has no appetite. He's not taking his medication. He does not specifically state if he is HI. Does admits to AVH. He denies having any physical pain. He doesn't want to participate in any more questions from me. He prefers to be left alone. Does admits to drinking alcohol, does not want to answer about rec drug use.  When clinician approached the room he could hear patient talking as if someone else was there and appeared to be focused on the far corner of room.  When clinician knocked, patient turned to face the wall closest to him.  Patient was belligerent to this clinician.  Patient said that he talks to himself a lot.  He said that he wanted a "lethal injection."  Patient said clinician needed to "look in his book to see why I drink like I do."  Clinician asked patient if he had been drinking tonight and patient started talking loudly about how it did not matter how much he drank.  Patient became more upset and told this clinician "get the fuck out of the room."  Clinician didn't want to agitate patient so he left.    -Clinician discussed patient care with Donell Sievert, PA who recommended that psychiatric team assess patient when he is sober.  Clinician informed PA at [facility] of disposition.  Diagnosis:  Axis 1: 303.90 ETOH use d/o severe; 296.53 Bipolar I d/o depressed, severe Axis 2: Deferred Axis 3: See H & P Axis 4:  psychosocial problems Axis 5: GAF 30  Past Medical History:  Past Medical History  Diagnosis Date  . Hyperlipidemia   . Hypertension   . Depression   . Anxiety   . Unspecified essential hypertension 10/14/2013  . Mixed hyperlipidemia 10/14/2013  . Bipolar 1 disorder, mixed, moderate (HCC) 10/14/2013    History reviewed. No pertinent past surgical history.  Family History:  Family History  Problem Relation Age of Onset  . Hyperlipidemia Mother   . Hypertension Mother   . Heart disease Mother 28    fatal  . Early death Mother   . Cancer Sister     breast  . Depression Sister   . Heart disease Brother   . Hyperlipidemia Brother   . Hypertension Brother   . Stroke Brother 28    OBESE 400#  . Heart disease Paternal Grandfather   . Hyperlipidemia Paternal Grandfather   . Hypertension Paternal Grandfather     Social History:  reports that he has been smoking.  He does not have any smokeless tobacco history on file. He reports that he drinks alcohol. He reports that he uses illicit drugs (Cocaine and Marijuana).  Additional Social History:  Alcohol / Drug Use Pain Medications: See PTA medication list Prescriptions: See PTA medication list Over the Counter: See PTA medication list History of alcohol / drug use?: Yes Substance #1 Name of Substance 1: ETOH 1 - Age of First  Use: 39 years of age 77 - Amount (size/oz): Varies 1 - Frequency: 2-3 times in a week 1 - Duration: for the past year 1 - Last Use / Amount: 11/03  Pt angrily said the amount did not matter. Substance #2 Name of Substance 2: Cocaine 2 - Age of First Use: Unknown 2 - Amount (size/oz): Varies 2 - Frequency: According to finances, will vary 2 - Duration: Claims to have only used it once before 2 - Last Use / Amount: Unknown  CIWA: CIWA-Ar BP: 121/85 mmHg Pulse Rate: 104 COWS:    PATIENT STRENGTHS: (choose at least two) Average or above average intelligence Capable of independent  living Communication skills  Allergies: No Known Allergies  Home Medications:  (Not in a hospital admission)  OB/GYN Status:  No LMP for male patient.  General Assessment Data Location of Assessment: [facility] TTS Assessment: In system Is this a Tele or Face-to-Face Assessment?: Face-to-Face Is this an Initial Assessment or a Re-assessment for this encounter?: Initial Assessment Marital status: Single Is patient pregnant?: No Pregnancy Status: No Living Arrangements: Alone Can pt return to current living arrangement?: Yes Admission Status: Voluntary Is patient capable of signing voluntary admission?: Yes Referral Source: Self/Family/Friend Insurance type: BC/BS     Crisis Care Plan Living Arrangements: Alone Name of Psychiatrist: None Reported Name of Therapist: None Reported  Education Status Is patient currently in school?: No  Risk to self with the past 6 months Suicidal Ideation: Yes-Currently Present Has patient been a risk to self within the past 6 months prior to admission? : Yes Suicidal Intent: Yes-Currently Present Has patient had any suicidal intent within the past 6 months prior to admission? : Yes Is patient at risk for suicide?: Yes Suicidal Plan?: No Has patient had any suicidal plan within the past 6 months prior to admission? : No Access to Means: No What has been your use of drugs/alcohol within the last 12 months?: ETOH use Previous Attempts/Gestures: No How many times?: 0 Other Self Harm Risks: None reported Triggers for Past Attempts: None known Intentional Self Injurious Behavior: None Family Suicide History: No Recent stressful life event(s): Other (Comment) (Using ETOH, guilt over recent use of cocaine.) Persecutory voices/beliefs?: Yes Depression: Yes Depression Symptoms: Despondent, Isolating, Feeling worthless/self pity, Feeling angry/irritable Substance abuse history and/or treatment for substance abuse?: Yes Suicide prevention  information given to non-admitted patients: Not applicable  Risk to Others within the past 6 months Homicidal Ideation: Yes-Currently Present Does patient have any lifetime risk of violence toward others beyond the six months prior to admission? : Unknown Thoughts of Harm to Others: Yes-Currently Present Comment - Thoughts of Harm to Others: Conflicted over whether to kill self or others. Current Homicidal Intent: No Current Homicidal Plan: No Access to Homicidal Means: No Identified Victim:  (Unknown) History of harm to others?: No Assessment of Violence: None Noted Violent Behavior Description: Unknown Does patient have access to weapons?: No Criminal Charges Pending?: No Does patient have a court date: No Is patient on probation?: No  Psychosis Hallucinations: Visual (Pt appeared to be talking to unseen person.) Delusions: None noted  Mental Status Report Appearance/Hygiene: Disheveled, In scrubs Eye Contact: Poor (Pt did not turn around to make any eye contact.) Motor Activity: Freedom of movement, Unremarkable Speech: Logical/coherent, Rapid Level of Consciousness: Alert, Irritable Mood: Depressed, Angry, Apprehensive, Threatening (Told clinician to get the fuck out of the room.) Affect: Irritable, Threatening Anxiety Level:  (Unable to assess.) Thought Processes: Unable to Assess Judgement: Impaired  Orientation: Unable to assess Obsessive Compulsive Thoughts/Behaviors: Unable to Assess  Cognitive Functioning Concentration: Unable to Assess Memory: Unable to Assess IQ: Average Insight: Unable to Assess Impulse Control: Unable to Assess Appetite:  (Unable to assess) Weight Loss: 0 Weight Gain: 0 Sleep: Unable to Assess Total Hours of Sleep:  (Unknown) Vegetative Symptoms: Unable to Assess  ADLScreening [facility] Assessment Services) Patient's cognitive ability adequate to safely complete daily activities?: Yes Patient able to express need for assistance with ADLs?:  Yes Independently performs ADLs?: Yes (appropriate for developmental age)  Prior Inpatient Therapy Prior Inpatient Therapy: Yes Prior Therapy Dates: 2015 [facility]  Reason for Treatment: Detox  Prior Outpatient Therapy Prior Outpatient Therapy: No Prior Therapy Dates:  (Unable to assess) Prior Therapy Facilty/Provider(s):  (Unknown) Reason for Treatment:  (Unable to assess) Does patient have an ACCT team?: Unknown Does patient have Intensive In-House Services?  : Unknown Does patient have [facility] services? : Unknown Does patient have P4CC services?: Unknown  ADL Screening (condition at time of admission) Patient's cognitive ability adequate to safely complete daily activities?: Yes Is the patient deaf or have difficulty hearing?: No Does the patient have difficulty seeing, even when wearing glasses/contacts?: No Does the patient have difficulty concentrating, remembering, or making decisions?: Yes Patient able to express need for assistance with ADLs?: Yes Does the patient have difficulty dressing or bathing?: No Independently performs ADLs?: Yes (appropriate for developmental age) Does the patient have difficulty walking or climbing stairs?: No Weakness of Legs: None Weakness of Arms/Hands: None       Abuse/Neglect Assessment (Assessment to be complete while patient is alone) Physical Abuse: Denies Verbal Abuse: Denies Sexual Abuse: Denies Exploitation of patient/patient's resources: Denies Self-Neglect: Denies     Merchant navy officerAdvance Directives (For Healthcare) Does patient have an advance directive?: No Would patient like information on creating an advanced directive?: No - patient declined information    Additional Information 1:1 In Past 12 Months?: No CIRT Risk: No Elopement Risk: No Does patient have medical clearance?: Yes     Disposition:  Disposition Initial Assessment Completed for this Encounter: Yes Disposition of  Patient: Other dispositions Other disposition(s): Other (Comment) (Pt needs AM psych eval when sober)  Alexandria LodgeHarvey, Brelan Hannen Ray 07/01/2015 5:57 AM

## 2015-07-01 NOTE — ED Notes (Signed)
Patient admits to [facility] with a plan to overdose. Patient denies HI and AVH at this time. Plan of care discussed with patient and patient voices no complaints or concerns at this time. Encouragement and support provided and safety maintain. Q 15 min safety checks  in place.

## 2015-07-01 NOTE — [facility] Suicide Risk Assessment (Signed)
Suicide Risk Assessment  Discharge Assessment   [facility]   Demographic Factors:  Male, Caucasian and Living alone  Total Time spent with patient: 45 minutes  Musculoskeletal: Strength & Muscle Tone: within normal limits Gait & Station: normal Patient leans: N/A  Psychiatric Specialty Exam: Review of Systems  Constitutional: Negative.   HENT: Negative.   Eyes: Negative.   Respiratory: Negative.   Cardiovascular: Negative.   Gastrointestinal: Negative.   Genitourinary: Negative.   Musculoskeletal: Negative.   Skin: Negative.   Neurological: Negative.   Endo/Heme/Allergies: Negative.   Psychiatric/Behavioral: Positive for depression and substance abuse.    Blood pressure 121/85, pulse 104, temperature 98.5 F (36.9 C), temperature source Oral, resp. rate 18, SpO2 100 %.There is no weight on file to calculate BMI.  General Appearance: Casual  Eye Contact::  Good  Speech:  Normal Rate  Volume:  Normal  Mood:  Irritable  Affect:  Congruent  Thought Process:  Coherent  Orientation:  Full (Time, Place, and Person)  Thought Content:  WDL  Suicidal Thoughts:  No  Homicidal Thoughts:  No  Memory:  Immediate;   Good Recent;   Good Remote;   Good  Judgement:  Fair  Insight:  Fair  Psychomotor Activity:  Normal  Concentration:  Good  Recall:  Good  Fund of Knowledge:Good  Language: Good  Akathisia:  No  Handed:  Right  AIMS (if indicated):     Assets:  Leisure Time Physical Health Resilience  ADL's:  Intact  Cognition: WNL  Sleep:         Has this patient used any form of tobacco in the last 30 days? (Cigarettes, Smokeless Tobacco, Cigars, and/or Pipes) Yes, A prescription for an FDA-approved tobacco cessation medication was offered at discharge and the patient refused  Mental Status Per Nursing Assessment::   On Admission:   Suicidal ideations  Current Mental Status by Physician: NA  Loss Factors: NA  Historical  Factors: NA  Risk Reduction Factors:   Sense of responsibility to family and Positive social support  Continued Clinical Symptoms:  Irritability   Cognitive Features That Contribute To Risk:  None    Suicide Risk:  Minimal: No identifiable suicidal ideation.  Patients presenting with no risk factors but with morbid ruminations; may be classified as minimal risk based on the severity of the depressive symptoms  Principal Problem: Alcohol-induced mood disorder [facility](HCC) Discharge Diagnoses:  Patient Active Problem List   Diagnosis Date Noted  . Alcohol abuse [F10.10] 07/01/2015    Priority: High  . Cocaine abuse [F14.10] 07/01/2015    Priority: High  . Alcohol-induced mood disorder (HCC) [F10.94] 07/01/2015    Priority: High  . Prediabetes [R73.03] 12/08/2013  . Chronic anxiety [F41.9] 12/08/2013  . Vitamin D deficiency [E55.9] 11/16/2013  . Medication management [Z79.899] 11/16/2013  . Essential hypertension [I10] 10/14/2013  . Hyperlipidemia [E78.2] 10/14/2013  . Bipolar 1 disorder, mixed, moderate (HCC) [F31.62] 10/14/2013      Plan Of Care/Follow-up recommendations:  Activity:  as tolerated Diet:  heart healthy diet  Is patient on multiple antipsychotic therapies at discharge:  No   Has Patient had three or more failed trials of antipsychotic monotherapy by history:  No  Recommended Plan for Multiple Antipsychotic Therapies: NA    Addysen Louth, PMH-NP 07/01/2015, 10:59 AM

## 2015-07-01 NOTE — ED Notes (Signed)
Pt transported from home by [facility] after calling for suicidal thoughts. Pt states he is having conflicting thoughts on whether to kill himself or people around him. Pt speaking loudly, laughing inappropriately with excessive profanity.

## 2015-07-01 NOTE — ED Provider Notes (Signed)
CSN: [id number]     Arrival date & time 07/01/15  0346 History   First MD Initiated Contact with Patient 07/01/15 0408     Chief Complaint  Patient presents with  . Suicidal     (Consider location/radiation/quality/duration/timing/severity/associated sxs/prior Treatment) HPI   39 year old male with history of bipolar, anxiety, depression, recurrent suicidal ideation presents for evaluation of suicidal ideation. History is difficult to obtain as patient's emotion is labile. Nursing note report pt was transported from home by [facility] after calling for suicidal thoughts.  When asked if he is suicidal pt admits he has been for the past 7 years, and admits to attempted SI with OD several times recently without success.  He doesn't get good sleep and have no appetite.  He's not taking his medication.  He does not specifically state if he is HI.  Does admits to AVH.  He denies having any physical pain.  He doesn't want to participate in any more questions from me.  He prefers to be left alone.  Does admits to drinking alcohol, does not want to answer about rec drug use.  Is a smoker.  Sts he recently finished a Zpak.    Past Medical History  Diagnosis Date  . Hyperlipidemia   . Hypertension   . Depression   . Anxiety   . Unspecified essential hypertension 10/14/2013  . Mixed hyperlipidemia 10/14/2013  . Bipolar 1 disorder, mixed, moderate (HCC) 10/14/2013   No past surgical history on file. Family History  Problem Relation Age of Onset  . Hyperlipidemia Mother   . Hypertension Mother   . Heart disease Mother [id number]    fatal  . Early death Mother   . Cancer Sister     breast  . Depression Sister   . Heart disease Brother   . Hyperlipidemia Brother   . Hypertension Brother   . Stroke Brother 28    OBESE 400#  . Heart disease Paternal Grandfather   . Hyperlipidemia Paternal Grandfather   . Hypertension Paternal Grandfather    Social History  Substance Use Topics  . Smoking status: Current  Every Day Smoker -- 0.50 packs/day for 15 years  . Smokeless tobacco: Not on file  . Alcohol Use: 0.0 oz/week    0 [place] drinks or equivalent per week     Comment: occasional    Review of Systems  Unable to perform ROS: Mental status change      Allergies  Review of patient's allergies indicates no known allergies.  Home Medications   Prior to Admission medications   Medication Sig Start Date End Date Taking? Authorizing Provider  benzonatate (TESSALON) 200 MG capsule Take 1 capsule (200 mg total) by mouth 3 (three) times daily as needed for cough. 06/23/15  Yes Courtney Forcucci, PA-C  fenofibrate (TRICOR) 145 MG tablet Take 1 tablet (145 mg total) by mouth daily. 06/23/15 06/22/16 Yes Courtney Forcucci, PA-C  lamoTRIgine (LAMICTAL) 25 MG tablet Take 1 tablet (25 mg total) by mouth 2 (two) times daily. 11/13/14 11/14/15 Yes Courtney Forcucci, PA-C  lisinopril (PRINIVIL,ZESTRIL) 20 MG tablet Take 1 tablet (20 mg total) by mouth daily. 06/23/15  Yes Courtney Forcucci, PA-C  meloxicam (MOBIC) 15 MG tablet Take 1 tablet (15 mg total) by mouth daily. Patient taking differently: Take 15 mg by mouth daily as needed (for back pain.).  02/18/15 02/18/16 Yes Courtney Forcucci, PA-C  Omega-3 Fatty Acids (FISH OIL) 1000 MG CAPS Take 1,000 mg by mouth daily.   Yes Historical Provider, MD  pravastatin (PRAVACHOL) 40 MG tablet Take 1 tablet (40 mg total) by mouth every evening. 02/19/15 02/19/16 Yes Courtney Forcucci, PA-C  tiZANidine (ZANAFLEX) 4 MG tablet Take 1 tablet (4 mg total) by mouth every 8 (eight) hours as needed for muscle spasms. Patient taking differently: Take 4 mg by mouth every 8 (eight) hours as needed for muscle spasms. For muscle spasms 02/18/15  Yes Courtney Forcucci, PA-C  azithromycin (ZITHROMAX Z-PAK) 250 MG tablet 2 po day one, then 1 daily x 4 days Patient taking differently: Take 250-500 mg by mouth as directed. 2 po day one, then 1 daily x 4 days 06/23/15   Toni Amend Forcucci,  PA-C  LORazepam (ATIVAN) 1 MG tablet Take 1 tablet (1 mg total) by mouth 2 (two) times daily. Patient not taking: Reported on 07/01/2015 11/13/14   Terri Piedra, PA-C  predniSONE (DELTASONE) 20 MG tablet 3 tabs po day one, then 2 tabs daily x 4 days Patient not taking: Reported on 07/01/2015 06/23/15   Toni Amend Forcucci, PA-C   BP 124/92 mmHg  Pulse 118  Temp(Src) 97.9 F (36.6 C) (Oral)  Resp 20  SpO2 96% Physical Exam  Constitutional: He appears well-developed and well-nourished. No distress.  Disheveled  HENT:  Head: Atraumatic.  Eyes: Conjunctivae are normal.  Neck: Neck supple.  Cardiovascular:  Mild tachycardia, no M/R/G  Pulmonary/Chest: Effort normal and breath sounds normal. He has no wheezes. He has no rales.  Abdominal: Soft. There is no tenderness.  Neurological: He is alert. GCS eye subscore is 4. GCS verbal subscore is 5. GCS motor subscore is 6.  Skin: No rash noted.  Psychiatric: His affect is labile. His speech is rapid and/or pressured. He is agitated. Thought content is paranoid. Cognition and memory are impaired. He expresses suicidal ideation. He expresses no homicidal ideation.  Pt speaking loudly, laughing inappropriately with excessive profanity.   Nursing note and vitals reviewed.   ED Course  Procedures (including critical care time)  4:44 AM Hx of SI, here for suicidal ideation without specific plan.  Pt will need TTS for further inpt psychiatric assessment.  Will perform medical clearance work up.    5:24 AM At this time pt does not have any significant medical condition that would preclude him from further psychiatric evaluation.    Labs Review Labs Reviewed  COMPREHENSIVE METABOLIC PANEL - Abnormal; Notable for the following:    Chloride 112 (*)    All other components within normal limits  ETHANOL - Abnormal; Notable for the following:    Alcohol, Ethyl (B) 204 (*)    All other components within normal limits  ACETAMINOPHEN LEVEL -  Abnormal; Notable for the following:    Acetaminophen (Tylenol), Serum <10 (*)    All other components within normal limits  CBC - Abnormal; Notable for the following:    WBC 14.3 (*)    Hemoglobin 17.3 (*)    All other components within normal limits  SALICYLATE LEVEL  URINE RAPID DRUG SCREEN, HOSP PERFORMED    Imaging Review No results found. I have personally reviewed and evaluated these images and lab results as part of my medical decision-making.   EKG Interpretation None      MDM   Final diagnoses:  Suicidal ideation    BP 121/85 mmHg  Pulse 104  Temp(Src) 98.5 F (36.9 C) (Oral)  Resp 18  SpO2 100%     Fayrene Helper, PA-C 07/01/15 1610  April Palumbo, MD 07/01/15 0600

## 2015-07-01 NOTE — ED Notes (Signed)
Patient discharged to home.  Left the unit ambulatory and all belongings returned.  [facility]

## 2015-08-12 ENCOUNTER — Encounter (HOSPITAL_COMMUNITY): Payer: Self-pay | Admitting: Psychiatry

## 2015-08-12 ENCOUNTER — Ambulatory Visit (INDEPENDENT_AMBULATORY_CARE_PROVIDER_SITE_OTHER): Payer: BLUE CROSS/BLUE SHIELD | Admitting: Psychiatry

## 2015-08-12 VITALS — BP 137/78 | HR 86 | Ht 69.0 in | Wt 171.0 lb

## 2015-08-12 DIAGNOSIS — F1410 Cocaine abuse, uncomplicated: Secondary | ICD-10-CM | POA: Diagnosis not present

## 2015-08-12 DIAGNOSIS — F1210 Cannabis abuse, uncomplicated: Secondary | ICD-10-CM | POA: Diagnosis not present

## 2015-08-12 DIAGNOSIS — F1010 Alcohol abuse, uncomplicated: Secondary | ICD-10-CM

## 2015-08-12 DIAGNOSIS — F3162 Bipolar disorder, current episode mixed, moderate: Secondary | ICD-10-CM | POA: Diagnosis not present

## 2015-08-12 MED ORDER — RISPERIDONE 1 MG PO TABS: 1.0000 mg | ORAL_TABLET | Freq: Every day | ORAL | Status: DC

## 2015-08-12 MED ORDER — LAMOTRIGINE 25 MG PO TABS: 25.0000 mg | ORAL_TABLET | Freq: Three times a day (TID) | ORAL | Status: DC
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
## 2015-09-13 ENCOUNTER — Ambulatory Visit (HOSPITAL_COMMUNITY): Payer: Self-pay | Admitting: Psychiatry

## 2015-09-15 ENCOUNTER — Ambulatory Visit (INDEPENDENT_AMBULATORY_CARE_PROVIDER_SITE_OTHER): Payer: BLUE CROSS/BLUE SHIELD | Admitting: Psychology

## 2015-09-15 ENCOUNTER — Encounter (HOSPITAL_COMMUNITY): Payer: Self-pay | Admitting: Psychology

## 2015-09-15 DIAGNOSIS — F3162 Bipolar disorder, current episode mixed, moderate: Secondary | ICD-10-CM | POA: Diagnosis not present

## 2015-09-15 DIAGNOSIS — F1010 Alcohol abuse, uncomplicated: Secondary | ICD-10-CM
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
## 2015-09-29 ENCOUNTER — Ambulatory Visit (INDEPENDENT_AMBULATORY_CARE_PROVIDER_SITE_OTHER): Payer: BLUE CROSS/BLUE SHIELD | Admitting: Internal Medicine

## 2015-09-29 ENCOUNTER — Encounter: Payer: Self-pay | Admitting: Internal Medicine

## 2015-09-29 VITALS — BP 108/70 | HR 89 | Temp 98.1°F | Resp 16 | Ht 69.0 in | Wt 177.0 lb

## 2015-09-29 DIAGNOSIS — I10 Essential (primary) hypertension: Secondary | ICD-10-CM | POA: Diagnosis not present

## 2015-09-29 DIAGNOSIS — R079 Chest pain, unspecified: Secondary | ICD-10-CM

## 2015-09-29 DIAGNOSIS — E781 Pure hyperglyceridemia: Secondary | ICD-10-CM

## 2015-09-29 DIAGNOSIS — Z79899 Other long term (current) drug therapy: Secondary | ICD-10-CM | POA: Diagnosis not present

## 2015-09-29 DIAGNOSIS — R7303 Prediabetes: Secondary | ICD-10-CM | POA: Diagnosis not present

## 2015-09-29 DIAGNOSIS — R1011 Right upper quadrant pain: Secondary | ICD-10-CM | POA: Diagnosis not present

## 2015-09-29 LAB — CBC WITH DIFFERENTIAL/PLATELET
BASOS ABS: 0 10*3/uL (ref 0.0–0.1)
Basophils Relative: 0 % (ref 0–1)
EOS ABS: 0.1 10*3/uL (ref 0.0–0.7)
Eosinophils Relative: 1 % (ref 0–5)
HCT: 45.1 % (ref 39.0–52.0)
Hemoglobin: 16.1 g/dL (ref 13.0–17.0)
LYMPHS ABS: 2 10*3/uL (ref 0.7–4.0)
Lymphocytes Relative: 14 % (ref 12–46)
MCH: 33 pg (ref 26.0–34.0)
MCHC: 35.7 g/dL (ref 30.0–36.0)
MCV: 92.4 fL (ref 78.0–100.0)
MONOS PCT: 5 % (ref 3–12)
MPV: 9.7 fL (ref 8.6–12.4)
Monocytes Absolute: 0.7 10*3/uL (ref 0.1–1.0)
Neutro Abs: 11.3 10*3/uL — ABNORMAL HIGH (ref 1.7–7.7)
Neutrophils Relative %: 80 % — ABNORMAL HIGH (ref 43–77)
PLATELETS: 289 10*3/uL (ref 150–400)
RBC: 4.88 MIL/uL (ref 4.22–5.81)
RDW: 13.1 % (ref 11.5–15.5)
WBC: 14.1 10*3/uL — ABNORMAL HIGH (ref 4.0–10.5)

## 2015-09-29 LAB — BASIC METABOLIC PANEL WITH GFR
BUN: 12 mg/dL (ref 7–25)
CALCIUM: 9.6 mg/dL (ref 8.6–10.3)
CO2: 25 mmol/L (ref 20–31)
CREATININE: 1.05 mg/dL (ref 0.60–1.35)
Chloride: 107 mmol/L (ref 98–110)
GFR, Est African American: >89 mL/min (ref >=60)
GFR, Est Non African American: 88 mL/min (ref >=60)
GLUCOSE: 97 mg/dL (ref 65–99)
Potassium: 4.2 mmol/L (ref 3.5–5.3)
Sodium: 140 mmol/L (ref 135–146)

## 2015-09-29 LAB — HEMOGLOBIN A1C
HEMOGLOBIN A1C: 5.4 % (ref <5.7)
Mean Plasma Glucose: 108 mg/dL (ref <117)

## 2015-09-29 LAB — LIPID PANEL
CHOL/HDL RATIO: 3.6 ratio (ref <=5.0)
CHOLESTEROL: 218 mg/dL — AB (ref 125–200)
HDL: 61 mg/dL (ref >=40)
LDL Cholesterol: 129 mg/dL (ref <130)
Triglycerides: 140 mg/dL (ref <150)
VLDL: 28 mg/dL (ref <30)

## 2015-09-29 LAB — TSH: TSH: 0.433 u[IU]/mL (ref 0.350–4.500)

## 2015-09-29 LAB — HEPATIC FUNCTION PANEL
ALT: 15 U/L (ref 9–46)
AST: 14 U/L (ref 10–40)
Albumin: 4.4 g/dL (ref 3.6–5.1)
Alkaline Phosphatase: 54 U/L (ref 40–115)
Bilirubin, Direct: 0.2 mg/dL (ref <=0.2)
Indirect Bilirubin: 0.6 mg/dL (ref 0.2–1.2)
TOTAL PROTEIN: 6.7 g/dL (ref 6.1–8.1)
Total Bilirubin: 0.8 mg/dL (ref 0.2–1.2)

## 2015-09-29 LAB — LIPASE: LIPASE: 16 U/L (ref 7–60)

## 2015-09-29 MED ORDER — FENOFIBRATE 145 MG PO TABS: 145.0000 mg | ORAL_TABLET | Freq: Every day | ORAL | Status: DC

## 2015-09-29 MED ORDER — OMEPRAZOLE 40 MG PO CPDR: 40.0000 mg | DELAYED_RELEASE_CAPSULE | Freq: Every day | ORAL | Status: DC

## 2015-09-29 NOTE — Progress Notes (Signed)
Patient ID: Eric Bentley, male   DOB: 1975/09/24, 40 y.o.   MRN: [id number]  Assessment and Plan:  Hypertension:  -Continue medication,  -monitor blood pressure at home.  -Continue DASH diet.   -Reminder to go to the ER if any CP, SOB, nausea, dizziness, severe HA, changes vision/speech, left arm numbness and tingling, and jaw pain.  Cholesterol: -Continue diet and exercise.  -Check cholesterol.   Pre-diabetes: -Continue diet and exercise.  -Check A1C  Vitamin D Def: -check level -continue medications.   Chest Pain unspecified -referral to cards -possibly secondary to GERD -has significant risk factors but given dizziness with exertion may need to get clearance with cards  RUQ -GERD vs. Pancreatitis vs. Possible cholelithiasis -omeprazole -RUQ ultrasound -Lipase -LFTs -avoid fatty foods, fried foods, an alcohol  Continue diet and meds as discussed. Further disposition pending results of labs.  HPI 40 y.o. male  presents for 3 month follow up with hypertension, hyperlipidemia, prediabetes and vitamin D.   His blood pressure has been controlled at home, today their BP is BP: 108/70 mmHg.   He does workout. He denies chest pain, shortness of breath, dizziness.  He does tend to have a job where is very active.     He is on cholesterol medication and denies myalgias. His cholesterol is at goal. The cholesterol last visit was:   Lab Results  Component Value Date   CHOL 198 06/23/2015   HDL 49 06/23/2015   LDLCALC 105 06/23/2015   TRIG 219* 06/23/2015   CHOLHDL 4.0 06/23/2015     He has been working on diet and exercise for prediabetes, and denies foot ulcerations, hyperglycemia, hypoglycemia , increased appetite, nausea, paresthesia of the feet, polydipsia, polyuria, visual disturbances, vomiting and weight loss. Last A1C in the office was:  Lab Results  Component Value Date   HGBA1C 5.6 02/18/2015    Patient is on Vitamin D supplement.  Lab Results  Component  Value Date   VD25OH 24* 02/18/2015     He is still [facility] but he is out of his medications.  He has been out for about a week.    He reports that he has been having some pain in the RUQ of his abdomen.  He reports that this has been bothering him several days.  He reports that he has been doing a lot of fast foods lately.  Eating does seem to bother it.   He does report that he has been having some chest pains and dizziness sometimes when he is climbing up ladders. He is a 2 pack per day smoker x 25 years and also has history of HTN and HL.  He has never seen cardiology.  Chest pains are intermittent at rest and with activity and don't seem to be related to eating per his report.    Current Medications:  Current Outpatient Prescriptions on File Prior to Visit  Medication Sig Dispense Refill  . lamoTRIgine (LAMICTAL) 25 MG tablet Take 1 tablet (25 mg total) by mouth 3 (three) times daily. 90 tablet 0  . lisinopril (PRINIVIL,ZESTRIL) 20 MG tablet Take 1 tablet (20 mg total) by mouth daily. 90 tablet 0  . meloxicam (MOBIC) 15 MG tablet Take 1 tablet (15 mg total) by mouth daily. (Patient taking differently: Take 15 mg by mouth daily as needed (for back pain.). ) 30 tablet 2  . Omega-3 Fatty Acids (FISH OIL) 1000 MG CAPS Take 1,000 mg by mouth daily.    Marland Kitchen  risperiDONE (RISPERDAL) 1 MG tablet Take 1 tablet (1 mg total) by mouth at bedtime. 30 tablet 0  . tiZANidine (ZANAFLEX) 4 MG tablet Take 1 tablet (4 mg total) by mouth every 8 (eight) hours as needed for muscle spasms. 90 tablet 1   No current facility-administered medications on file prior to visit.    Medical History:  Past Medical History  Diagnosis Date  . Hyperlipidemia   . Hypertension   . Depression   . Anxiety   . Unspecified essential hypertension 10/14/2013  . Mixed hyperlipidemia 10/14/2013  . Bipolar 1 disorder, mixed, moderate (HCC) 10/14/2013    Allergies: No Known Allergies    Review of Systems:  Review of Systems  Constitutional: Negative for fever, chills and malaise/fatigue.  HENT: Negative for congestion, ear pain and sore throat.   Respiratory: Positive for cough, shortness of breath and wheezing.   Cardiovascular: Positive for chest pain. Negative for palpitations and leg swelling.  Gastrointestinal: Positive for heartburn and abdominal pain. Negative for diarrhea, constipation, blood in stool and melena.  Genitourinary: Negative.   Skin: Negative.   Neurological: Positive for dizziness. Negative for sensory change, loss of consciousness and headaches.    Family history- Review and unchanged  Social history- Review and unchanged  Physical Exam: BP 108/70 mmHg  Pulse 89  Temp(Src) 98.1 F (36.7 C) (Temporal)  Resp 16  Ht  (1.753 m)  Wt 177 lb (80.287 kg)  BMI 26.13 kg/m2  SpO2 98% Wt Readings from Last 3 Encounters:  09/29/15 177 lb (80.287 kg)  08/12/15 171 lb (77.565 kg)  02/18/15 206 lb (93.441 kg)    General Appearance: Well nourished well developed, in no apparent distress. Eyes: PERRLA, EOMs, conjunctiva no swelling or erythema ENT/Mouth: Ear canals normal without obstruction, swelling, erythma, discharge.  TMs normal bilaterally.  Oropharynx moist, clear, without exudate, or postoropharyngeal swelling. Neck: Supple, thyroid normal,no cervical adenopathy  Respiratory: Respiratory effort normal, Breath sounds clear A&P without rhonchi, wheeze, or rale.  No retractions, no accessory usage. Cardio: RRR with no MRGs. Brisk peripheral pulses without edema.  Abdomen: Soft, + BS,  Non tender, no guarding, rebound, hernias, masses. Musculoskeletal: Full ROM, 5/5 strength, Normal gait Skin: Warm, dry without rashes, lesions, ecchymosis.  Neuro: Awake and oriented X 3, Cranial nerves intact. Normal muscle tone, no cerebellar symptoms. Psych: Normal affect, Insight and Judgment appropriate.    Terri Piedra, PA-C 9:25  AM [facility]

## 2015-09-30 ENCOUNTER — Encounter: Payer: Self-pay | Admitting: Cardiovascular Disease

## 2015-09-30 ENCOUNTER — Ambulatory Visit (INDEPENDENT_AMBULATORY_CARE_PROVIDER_SITE_OTHER): Payer: BLUE CROSS/BLUE SHIELD | Admitting: Cardiovascular Disease

## 2015-09-30 VITALS — BP 130/90 | HR 90 | Ht 69.0 in | Wt 177.2 lb

## 2015-09-30 DIAGNOSIS — R0789 Other chest pain: Secondary | ICD-10-CM

## 2015-09-30 DIAGNOSIS — E782 Mixed hyperlipidemia: Secondary | ICD-10-CM | POA: Diagnosis not present

## 2015-09-30 DIAGNOSIS — I10 Essential (primary) hypertension: Secondary | ICD-10-CM | POA: Diagnosis not present

## 2015-09-30 NOTE — Patient Instructions (Signed)
Your physician has requested that you have en exercise stress myoview. For further information please visit [email or web address] Please follow instruction sheet, as given.  Your physician has requested that you have an echocardiogram [address]. Echocardiography is a painless test that uses sound waves to create images of your heart. It provides your doctor with information about the size and shape of your heart and how well your heart's chambers and valves are working. This procedure takes approximately one hour. There are no restrictions for this procedure.  IF TEST ARE NORMAL , FOLLOW UP ON AS NEEDED BASIS. ,IF ABNORMAL AN APPOINTMENT WILL BE ARRANGED   Your physician recommends that you schedule a follow-up appointment AS NEEDED BASIS

## 2015-09-30 NOTE — Assessment & Plan Note (Signed)
History of hypertension with blood pressure today 130/90. He is on lisinopril. Continue current meds at current dosing

## 2015-09-30 NOTE — Assessment & Plan Note (Signed)
The patient was sent to me by his primary care provider for atypical chest pain. Does have a chronic risk factors and also complains of increasing dyspnea on exertion. His pain is somewhat atypical. I'm going to get an exercise Myoview stress test and a 2-D echocardiogram

## 2015-09-30 NOTE — Assessment & Plan Note (Addendum)
History of hyperlipidemia/hypertriglyceridemia on fenofibrate with his recent lipid profile performed 09/29/15 revealing total cholesterol of 218, LDL 129, HDL 61 and triglyceride level of 140. He does admit to dietary indiscretion as well and drinks 6- 40 ounce bottles of beer a week.

## 2015-09-30 NOTE — Progress Notes (Signed)
09/30/2015 Eric Bentley   1976/01/20  [id number]  Primary Physician MCKEOWN,WILLIAM DAVID, MD Primary Cardiologist: Runell Gess MD [name]   HPI:  Eric Bentley is a 40 year old single Caucasian male with no children and works in [employer]. He was referred by Terri Piedra  [facility] for evaluation of chest pain. Patient has multiple cardiac risk factors including 50-pack-years of tobacco abuse currently smoking 2 packs a day. He also smokes on and uses cocaine on occasion. This history of hypertension and hyperlipidemia. He drinks 640 ounce containers of beer a week as well he has bipolar disorder on medication. He gets occasional chest pressure with increasing dyspnea on exertion. He also suffers from panic attacks.   Current Outpatient Prescriptions  Medication Sig Dispense Refill  . fenofibrate (TRICOR) 145 MG tablet Take 1 tablet (145 mg total) by mouth daily. 30 tablet 11  . lamoTRIgine (LAMICTAL) 25 MG tablet Take 1 tablet (25 mg total) by mouth 3 (three) times daily. 90 tablet 0  . lisinopril (PRINIVIL,ZESTRIL) 20 MG tablet Take 1 tablet (20 mg total) by mouth daily. 90 tablet 0  . meloxicam (MOBIC) 15 MG tablet Take 1 tablet (15 mg total) by mouth daily. (Patient taking differently: Take 15 mg by mouth daily as needed (for back pain.). ) 30 tablet 2  . Omega-3 Fatty Acids (FISH OIL) 1000 MG CAPS Take 1,000 mg by mouth daily.    Marland Kitchen omeprazole (PRILOSEC) 40 MG capsule Take 1 capsule (40 mg total) by mouth daily. 30 capsule 1  . risperiDONE (RISPERDAL) 1 MG tablet Take 1 tablet (1 mg total) by mouth at bedtime. 30 tablet 0  . tiZANidine (ZANAFLEX) 4 MG tablet Take 1 tablet (4 mg total) by mouth every 8 (eight) hours as needed for muscle spasms. 90 tablet 1   No current facility-administered medications for this visit.    No Known Allergies  Social History   Social History  . Marital Status: Single    Spouse Name: N/A  . Number of Children: N/A  .  Years of Education: N/A   Occupational History  . Not on file.   Social History Main Topics  . Smoking status: Current Every Day Smoker -- 0.50 packs/day for 15 years  . Smokeless tobacco: Not on file  . Alcohol Use: 0.0 oz/week    0 [place] drinks or equivalent per week     Comment: occasional  . Drug Use: Yes    Special: Cocaine, Marijuana     Comment: crack  . Sexual Activity: Not on file   Other Topics Concern  . Not on file   Social History Narrative     Review of Systems: General: negative for chills, fever, night sweats or weight changes.  Cardiovascular: negative for chest pain, dyspnea on exertion, edema, orthopnea, palpitations, paroxysmal nocturnal dyspnea or shortness of breath Dermatological: negative for rash Respiratory: negative for cough or wheezing Urologic: negative for hematuria Abdominal: negative for nausea, vomiting, diarrhea, bright red blood per rectum, melena, or hematemesis Neurologic: negative for visual changes, syncope, or dizziness All other systems reviewed and are otherwise negative except as noted above.    Blood pressure 130/90, pulse 90, height  (1.753 m), weight 177 lb 3 oz (80.372 kg).  General appearance: alert and no distress Neck: no adenopathy, no carotid bruit, no JVD, supple, symmetrical, trachea midline and thyroid not enlarged, symmetric, no tenderness/mass/nodules Lungs: clear to auscultation bilaterally Heart: regular rate and rhythm, S1, S2 normal, no murmur, click, rub  or gallop Extremities: extremities normal, atraumatic, no cyanosis or edema  EKG sinus rhythm at 90 without ST or T-wave changes. I personally reviewed this EKG  ASSESSMENT AND PLAN:   Essential hypertension History of hypertension with blood pressure today 130/90. He is on lisinopril. Continue current meds at current dosing  Hyperlipidemia History of hyperlipidemia/hypertriglyceridemia on fenofibrate with his recent lipid profile performed 09/29/15  revealing total cholesterol of 218, LDL 129, HDL 61 and triglyceride level of 140. He does admit to dietary indiscretion as well and drinks 6- 40 ounce bottles of beer a week.  Atypical chest pain The patient was sent to me by his primary care provider for atypical chest pain. Does have a chronic risk factors and also complains of increasing dyspnea on exertion. His pain is somewhat atypical. I'm going to get an exercise Myoview stress test and a 2-D echocardiogram      Runell Gess MD [facility], [facility] 09/30/2015 4:00 PM

## 2015-10-07 ENCOUNTER — Ambulatory Visit
Admission: RE | Admit: 2015-10-07 | Discharge: 2015-10-07 | Disposition: A | Discharge status: Home / Self Care | Payer: BLUE CROSS/BLUE SHIELD | Source: Ambulatory Visit | Attending: Internal Medicine | Admitting: Internal Medicine

## 2015-10-07 DIAGNOSIS — R1011 Right upper quadrant pain: Secondary | ICD-10-CM

## 2015-10-08 ENCOUNTER — Telehealth (HOSPITAL_COMMUNITY): Payer: Self-pay

## 2015-10-08 NOTE — Telephone Encounter (Signed)
Encounter complete. 

## 2015-10-13 ENCOUNTER — Ambulatory Visit (HOSPITAL_COMMUNITY)
Admission: RE | Admit: 2015-10-13 | Discharge: 2015-10-13 | Disposition: A | Discharge status: Home / Self Care | Payer: BLUE CROSS/BLUE SHIELD | Source: Ambulatory Visit | Attending: Cardiovascular Disease | Admitting: Cardiovascular Disease

## 2015-10-13 ENCOUNTER — Ambulatory Visit (HOSPITAL_COMMUNITY): Payer: Self-pay

## 2015-10-13 DIAGNOSIS — R0609 Other forms of dyspnea: Secondary | ICD-10-CM | POA: Insufficient documentation

## 2015-10-13 DIAGNOSIS — R0789 Other chest pain: Secondary | ICD-10-CM | POA: Diagnosis not present

## 2015-10-13 DIAGNOSIS — I10 Essential (primary) hypertension: Secondary | ICD-10-CM | POA: Diagnosis not present

## 2015-10-13 DIAGNOSIS — F17200 Nicotine dependence, unspecified, uncomplicated: Secondary | ICD-10-CM | POA: Insufficient documentation

## 2015-10-13 DIAGNOSIS — R079 Chest pain, unspecified: Secondary | ICD-10-CM | POA: Diagnosis present

## 2015-10-13 DIAGNOSIS — R42 Dizziness and giddiness: Secondary | ICD-10-CM | POA: Insufficient documentation

## 2015-10-13 DIAGNOSIS — Z8249 Family history of ischemic heart disease and other diseases of the circulatory system: Secondary | ICD-10-CM | POA: Diagnosis not present

## 2015-10-13 DIAGNOSIS — R5383 Other fatigue: Secondary | ICD-10-CM | POA: Diagnosis not present

## 2015-10-13 DIAGNOSIS — E782 Mixed hyperlipidemia: Secondary | ICD-10-CM

## 2015-10-13 LAB — MYOCARDIAL PERFUSION IMAGING
CHL CUP MPHR: 179 {beats}/min
CHL CUP NUCLEAR SDS: 1
CHL CUP NUCLEAR SRS: 4
CHL CUP NUCLEAR SSS: 5
CHL CUP RESTING HR STRESS: 69 {beats}/min
CSEPEDS: 30 s
CSEPHR: 87 %
Estimated workload: 10.1 METS
Exercise duration (min): 9 min
LV sys vol: 41 mL
LVDIAVOL: 98 mL
NUC STRESS TID: 0.88
Peak HR: 157 {beats}/min
RPE: 17

## 2015-10-13 MED ORDER — TECHNETIUM TC 99M SESTAMIBI GENERIC - CARDIOLITE
30.1000 | Freq: Once | INTRAVENOUS | Status: AC | PRN
  Administered 2015-10-13: 30.1 via INTRAVENOUS

## 2015-10-13 MED ORDER — TECHNETIUM TC 99M SESTAMIBI GENERIC - CARDIOLITE
10.4000 | Freq: Once | INTRAVENOUS | Status: AC | PRN
  Administered 2015-10-13: 10.4 via INTRAVENOUS

## 2015-10-18 ENCOUNTER — Ambulatory Visit (INDEPENDENT_AMBULATORY_CARE_PROVIDER_SITE_OTHER): Payer: BLUE CROSS/BLUE SHIELD | Admitting: Psychology

## 2015-10-18 ENCOUNTER — Encounter (HOSPITAL_COMMUNITY): Payer: Self-pay | Admitting: *Deleted

## 2015-10-18 ENCOUNTER — Inpatient Hospital Stay (HOSPITAL_COMMUNITY)
Admission: AD | Admit: 2015-10-18 | Discharge: 2015-10-26 | DRG: 885 | Disposition: A | Discharge status: Home / Self Care | Payer: BLUE CROSS/BLUE SHIELD | Attending: Psychiatry | Admitting: Psychiatry

## 2015-10-18 DIAGNOSIS — E781 Pure hyperglyceridemia: Secondary | ICD-10-CM

## 2015-10-18 DIAGNOSIS — Z809 Family history of malignant neoplasm, unspecified: Secondary | ICD-10-CM | POA: Diagnosis not present

## 2015-10-18 DIAGNOSIS — I10 Essential (primary) hypertension: Secondary | ICD-10-CM | POA: Diagnosis present

## 2015-10-18 DIAGNOSIS — M545 Low back pain: Secondary | ICD-10-CM | POA: Diagnosis present

## 2015-10-18 DIAGNOSIS — F1410 Cocaine abuse, uncomplicated: Secondary | ICD-10-CM | POA: Diagnosis present

## 2015-10-18 DIAGNOSIS — F3160 Bipolar disorder, current episode mixed, unspecified: Secondary | ICD-10-CM | POA: Diagnosis not present

## 2015-10-18 DIAGNOSIS — G8929 Other chronic pain: Secondary | ICD-10-CM | POA: Diagnosis present

## 2015-10-18 DIAGNOSIS — F17210 Nicotine dependence, cigarettes, uncomplicated: Secondary | ICD-10-CM | POA: Diagnosis present

## 2015-10-18 DIAGNOSIS — F3162 Bipolar disorder, current episode mixed, moderate: Secondary | ICD-10-CM | POA: Diagnosis present

## 2015-10-18 DIAGNOSIS — R45851 Suicidal ideations: Secondary | ICD-10-CM | POA: Diagnosis present

## 2015-10-18 DIAGNOSIS — F1020 Alcohol dependence, uncomplicated: Secondary | ICD-10-CM | POA: Diagnosis present

## 2015-10-18 DIAGNOSIS — Z8249 Family history of ischemic heart disease and other diseases of the circulatory system: Secondary | ICD-10-CM

## 2015-10-18 DIAGNOSIS — M25559 Pain in unspecified hip: Secondary | ICD-10-CM | POA: Diagnosis present

## 2015-10-18 DIAGNOSIS — K219 Gastro-esophageal reflux disease without esophagitis: Secondary | ICD-10-CM | POA: Diagnosis present

## 2015-10-18 DIAGNOSIS — F419 Anxiety disorder, unspecified: Secondary | ICD-10-CM | POA: Diagnosis present

## 2015-10-18 DIAGNOSIS — Z818 Family history of other mental and behavioral disorders: Secondary | ICD-10-CM | POA: Diagnosis not present

## 2015-10-18 DIAGNOSIS — F1210 Cannabis abuse, uncomplicated: Secondary | ICD-10-CM | POA: Diagnosis present

## 2015-10-18 DIAGNOSIS — E782 Mixed hyperlipidemia: Secondary | ICD-10-CM | POA: Diagnosis present

## 2015-10-18 DIAGNOSIS — M5441 Lumbago with sciatica, right side: Secondary | ICD-10-CM

## 2015-10-18 DIAGNOSIS — F339 Major depressive disorder, recurrent, unspecified: Secondary | ICD-10-CM | POA: Diagnosis present

## 2015-10-18 DIAGNOSIS — G4700 Insomnia, unspecified: Secondary | ICD-10-CM | POA: Diagnosis present

## 2015-10-18 DIAGNOSIS — Z823 Family history of stroke: Secondary | ICD-10-CM

## 2015-10-18 LAB — URINALYSIS, ROUTINE W REFLEX MICROSCOPIC
BILIRUBIN URINE: NEGATIVE
GLUCOSE, UA: NEGATIVE mg/dL
HGB URINE DIPSTICK: NEGATIVE
Ketones, ur: NEGATIVE mg/dL
Leukocytes, UA: NEGATIVE
NITRITE: NEGATIVE
PH: 6 (ref 5.0–8.0)
Protein, ur: NEGATIVE mg/dL
SPECIFIC GRAVITY, URINE: 1.005 (ref 1.005–1.030)

## 2015-10-18 LAB — RAPID URINE DRUG SCREEN, HOSP PERFORMED
AMPHETAMINES: NOT DETECTED
BENZODIAZEPINES: NOT DETECTED
Barbiturates: NOT DETECTED
COCAINE: POSITIVE — AB
OPIATES: NOT DETECTED
TETRAHYDROCANNABINOL: POSITIVE — AB

## 2015-10-18 LAB — TSH: TSH: 1.067 u[IU]/mL (ref 0.350–4.500)

## 2015-10-18 LAB — ETHANOL: Alcohol, Ethyl (B): <5 mg/dL (ref <5)

## 2015-10-18 MED ORDER — FENOFIBRATE 54 MG PO TABS
54.0000 mg | ORAL_TABLET | Freq: Every day | ORAL | Status: DC
  Administered 2015-10-18 – 2015-10-26 (×9): 54 mg via ORAL
  Filled 2015-10-18 (×12): qty 1

## 2015-10-18 MED ORDER — LORAZEPAM 1 MG PO TABS
1.0000 mg | ORAL_TABLET | Freq: Four times a day (QID) | ORAL | Status: AC
  Administered 2015-10-18 (×3): 1 mg via ORAL
  Filled 2015-10-18 (×3): qty 1

## 2015-10-18 MED ORDER — LORAZEPAM 1 MG PO TABS
1.0000 mg | ORAL_TABLET | Freq: Every day | ORAL | Status: AC
  Administered 2015-10-21: 1 mg via ORAL
  Filled 2015-10-18: qty 1

## 2015-10-18 MED ORDER — PANTOPRAZOLE SODIUM 40 MG PO TBEC
80.0000 mg | DELAYED_RELEASE_TABLET | Freq: Every day | ORAL | Status: DC
  Administered 2015-10-20 – 2015-10-26 (×5): 80 mg via ORAL
  Filled 2015-10-18 (×11): qty 2

## 2015-10-18 MED ORDER — LISINOPRIL 20 MG PO TABS
20.0000 mg | ORAL_TABLET | Freq: Every day | ORAL | Status: DC
  Administered 2015-10-18 – 2015-10-26 (×9): 20 mg via ORAL
  Filled 2015-10-18 (×12): qty 1

## 2015-10-18 MED ORDER — NICOTINE 21 MG/24HR TD PT24
21.0000 mg | MEDICATED_PATCH | Freq: Every day | TRANSDERMAL | Status: DC
  Administered 2015-10-19 – 2015-10-26 (×8): 21 mg via TRANSDERMAL
  Filled 2015-10-18 (×12): qty 1

## 2015-10-18 MED ORDER — ARIPIPRAZOLE 5 MG PO TABS
5.0000 mg | ORAL_TABLET | Freq: Every day | ORAL | Status: DC
  Administered 2015-10-18 – 2015-10-20 (×3): 5 mg via ORAL
  Filled 2015-10-18 (×6): qty 1

## 2015-10-18 MED ORDER — LAMOTRIGINE 25 MG PO TABS: 25.0000 mg | ORAL_TABLET | Freq: Every day | ORAL | Status: DC

## 2015-10-18 MED ORDER — HYDROXYZINE HCL 25 MG PO TABS
25.0000 mg | ORAL_TABLET | Freq: Four times a day (QID) | ORAL | Status: DC | PRN
  Administered 2015-10-18: 25 mg via ORAL
  Filled 2015-10-18: qty 1

## 2015-10-18 MED ORDER — THIAMINE HCL 100 MG/ML IJ SOLN
100.0000 mg | Freq: Once | INTRAMUSCULAR | Status: AC
  Administered 2015-10-18: 100 mg via INTRAMUSCULAR
  Filled 2015-10-18: qty 2

## 2015-10-18 MED ORDER — ACETAMINOPHEN 325 MG PO TABS
650.0000 mg | ORAL_TABLET | Freq: Four times a day (QID) | ORAL | Status: DC | PRN
  Administered 2015-10-21 – 2015-10-26 (×7): 650 mg via ORAL
  Filled 2015-10-18 (×8): qty 2

## 2015-10-18 MED ORDER — LORAZEPAM 1 MG PO TABS
1.0000 mg | ORAL_TABLET | Freq: Three times a day (TID) | ORAL | Status: AC
  Administered 2015-10-19 (×3): 1 mg via ORAL
  Filled 2015-10-18 (×3): qty 1

## 2015-10-18 MED ORDER — MELOXICAM 15 MG PO TABS
15.0000 mg | ORAL_TABLET | Freq: Every day | ORAL | Status: DC
  Administered 2015-10-18 – 2015-10-26 (×9): 15 mg via ORAL
  Filled 2015-10-18: qty 2
  Filled 2015-10-18 (×11): qty 1

## 2015-10-18 MED ORDER — LORAZEPAM 1 MG PO TABS: 1.0000 mg | ORAL_TABLET | Freq: Four times a day (QID) | ORAL | Status: AC | PRN

## 2015-10-18 MED ORDER — LOPERAMIDE HCL 2 MG PO CAPS: 2.0000 mg | ORAL_CAPSULE | ORAL | Status: AC | PRN

## 2015-10-18 MED ORDER — HYDROXYZINE HCL 25 MG PO TABS
25.0000 mg | ORAL_TABLET | Freq: Four times a day (QID) | ORAL | Status: AC | PRN
  Administered 2015-10-19 – 2015-10-21 (×3): 25 mg via ORAL
  Filled 2015-10-18 (×3): qty 1

## 2015-10-18 MED ORDER — ALUM & MAG HYDROXIDE-SIMETH 200-200-20 MG/5ML PO SUSP: 30.0000 mL | ORAL | Status: DC | PRN

## 2015-10-18 MED ORDER — ONDANSETRON 4 MG PO TBDP: 4.0000 mg | ORAL_TABLET | Freq: Four times a day (QID) | ORAL | Status: AC | PRN

## 2015-10-18 MED ORDER — BENZTROPINE MESYLATE 0.5 MG PO TABS
0.5000 mg | ORAL_TABLET | Freq: Every day | ORAL | Status: DC
  Administered 2015-10-18: 0.5 mg via ORAL
  Filled 2015-10-18 (×4): qty 1

## 2015-10-18 MED ORDER — LORAZEPAM 1 MG PO TABS
1.0000 mg | ORAL_TABLET | Freq: Two times a day (BID) | ORAL | Status: AC
  Administered 2015-10-20 (×2): 1 mg via ORAL
  Filled 2015-10-18 (×2): qty 1

## 2015-10-18 MED ORDER — CARBAMAZEPINE ER 100 MG PO TB12
100.0000 mg | ORAL_TABLET | Freq: Two times a day (BID) | ORAL | Status: DC
  Filled 2015-10-18 (×3): qty 1

## 2015-10-18 MED ORDER — ADULT MULTIVITAMIN W/MINERALS CH
1.0000 | ORAL_TABLET | Freq: Every day | ORAL | Status: DC
  Administered 2015-10-18 – 2015-10-26 (×9): 1 via ORAL
  Filled 2015-10-18 (×13): qty 1

## 2015-10-18 MED ORDER — LAMOTRIGINE 25 MG PO TABS
25.0000 mg | ORAL_TABLET | Freq: Every evening | ORAL | Status: DC
  Administered 2015-10-19: 25 mg via ORAL
  Filled 2015-10-18 (×2): qty 1

## 2015-10-18 MED ORDER — QUETIAPINE FUMARATE 25 MG PO TABS
25.0000 mg | ORAL_TABLET | Freq: Every day | ORAL | Status: DC
  Filled 2015-10-18: qty 1

## 2015-10-18 MED ORDER — VITAMIN B-1 100 MG PO TABS
100.0000 mg | ORAL_TABLET | Freq: Every day | ORAL | Status: DC
  Administered 2015-10-19 – 2015-10-26 (×8): 100 mg via ORAL
  Filled 2015-10-18 (×11): qty 1

## 2015-10-18 MED ORDER — VENLAFAXINE HCL ER 150 MG PO CP24
150.0000 mg | ORAL_CAPSULE | Freq: Every day | ORAL | Status: DC
  Filled 2015-10-18: qty 1

## 2015-10-18 MED ORDER — LAMOTRIGINE 25 MG PO TABS
25.0000 mg | ORAL_TABLET | Freq: Every evening | ORAL | Status: DC
  Filled 2015-10-18: qty 1

## 2015-10-18 MED ORDER — MAGNESIUM HYDROXIDE 400 MG/5ML PO SUSP
30.0000 mL | Freq: Every day | ORAL | Status: DC | PRN
  Administered 2015-10-19 – 2015-10-20 (×2): 30 mL via ORAL
  Filled 2015-10-18 (×2): qty 30
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
## 2015-10-18 NOTE — Progress Notes (Signed)
   THERAPIST PROGRESS NOTE  Session Time: 10am-10.42am  Participation Level: Active  Behavioral Response: CasualAlertAnxious  Type of Therapy: Individual Therapy  Treatment Goals addressed: Diagnosis: Bipolar D/O 1 and goal 1.  Interventions: CBT and Supportive  Summary: Eric Bentley is a 40 y.o. male who presents with report of feeling anxious- very anxious.  Pt reported initially anxious over missing money.  Pt thinking is scattered.  Pt reports that mind "all over the place" and scared of what he might do.  Pt denies any suicidal thoughts- but doesn't feel he can't plan for his safety either and isn't safe returning home.  Pt continues to state that he needs to be upstairs- "referring to inpt unit" till he can get clear.  Pt stressors continue to include is work, feeling stressed w/ work 7 days a week.  Pt denies any drug use, pt reports drinks about 2 beers a week.  Pt denies any hallucinations.     Suicidal/Homicidal: pt denies suicidal thoughts, but expresses doesn't feel safe and feels unpredictable w/ thoughts and actions.  Therapist Response: Counselor assessed pt current functioning and explored w/ pt his report of anxiety.  Explored w/ pt any thoughts of SI or hallucinations.  Began planning for safety w/ pt and discussed options for assessment for inpt unit.  Facilitated pt acess as walkin and shared w/ assessment pt current functioning.    Plan: pt to complete assessment w/ TTS department.    Diagnosis: Bipolar D/O mixed    YATES,LEANNE, LPC 10/18/2015

## 2015-10-18 NOTE — Progress Notes (Signed)
D:Patient in the dayroom on approach.  Patient states he is still getting adjusted to the unit.  Patient states, "I love Ativan It works wonders."  Patient denies SI/HI and denies AVH.   A: Staff to monitor Q 15 mins for safety.  Encouragement and support offered.  Scheduled medications administered per orders. R: Patient remains safe on the unit.  Patient attended group tonight.  Patient visible on the unit and interacting with peers.  Patient taking administered medications.

## 2015-10-18 NOTE — Tx Team (Signed)
Initial Interdisciplinary Treatment Plan   PATIENT STRESSORS: Medication change or noncompliance   PATIENT STRENGTHS: Capable of independent living Financial means Motivation for treatment/growth Physical Health Work skills   PROBLEM LIST: Problem List/Patient Goals Date to be addressed Date deferred Reason deferred Estimated date of resolution  "My brain in not working right" 10/18/2015     Medication noncompliance 10/18/2015     Depression 10/18/2015     Suicidal Ideation 10/18/2015                                    DISCHARGE CRITERIA:  Ability to meet basic life and health needs Motivation to continue treatment in a less acute level of care Need for constant or close observation no longer present Verbal commitment to aftercare and medication compliance  PRELIMINARY DISCHARGE PLAN: Outpatient therapy Return to previous living arrangement Return to previous work or school arrangements  PATIENT/FAMIILY INVOLVEMENT: This treatment plan has been presented to and reviewed with the patient, Eric Bentley.  The patient and family have been given the opportunity to ask questions and make suggestions.  Cranford Mon 10/18/2015, 1:01 PM

## 2015-10-18 NOTE — BHH Suicide Risk Assessment (Signed)
[facility] Admission Suicide Risk Assessment   Nursing information obtained from:    Demographic factors:    Current Mental Status:    Loss Factors:    Historical Factors:    Risk Reduction Factors:     Total Time spent with patient: 30 minutes Principal Problem: Bipolar 1 disorder, mixed, moderate (HCC) Diagnosis:   Patient Active Problem List   Diagnosis Date Noted  . Alcohol use disorder, moderate, dependence (HCC) [F10.20] 10/18/2015  . Atypical chest pain [R07.89] 09/30/2015  . Cocaine abuse [F14.10] 07/01/2015  . Prediabetes [R73.03] 12/08/2013  . Chronic anxiety [F41.9] 12/08/2013  . Vitamin D deficiency [E55.9] 11/16/2013  . Medication management [Z79.899] 11/16/2013  . Essential hypertension [I10] 10/14/2013  . Hyperlipidemia [E78.2] 10/14/2013  . Bipolar 1 disorder, mixed, moderate (HCC) [F31.62] 10/14/2013   Subjective Data: Pt states " I feel aggravated , I feel like I have a lot of racing thoughts.'   Continued Clinical Symptoms:  Alcohol Use Disorder Identification Test Final Score (AUDIT): 2 The "Alcohol Use Disorders Identification Test", Guidelines for Use in Primary Care, Second Edition.  World Science writer [facility]). Score between 0-7:  no or low risk or alcohol related problems. Score between 8-15:  moderate risk of alcohol related problems. Score between 16-19:  high risk of alcohol related problems. Score 20 or above:  warrants further diagnostic evaluation for alcohol dependence and treatment.   CLINICAL FACTORS:   Alcohol/Substance Abuse/Dependencies Previous Psychiatric Diagnoses and Treatments   Musculoskeletal: Strength & Muscle Tone: within normal limits Gait & Station: normal Patient leans: N/A  Psychiatric Specialty Exam: Review of Systems  Psychiatric/Behavioral: Positive for depression and substance abuse. The patient is nervous/anxious.   All other systems reviewed and are negative.   Blood pressure 126/83, pulse 105, temperature 98.4  F (36.9 C), temperature source Oral, resp. rate 18, height 5' 8.2" (1.732 m), weight 79.379 kg (175 lb).Body mass index is 26.46 kg/(m^2).  General Appearance: Disheveled  Eye Solicitor::  Fair  Speech:  Clear and Coherent  Volume:  Increased  Mood:  Anxious  Affect:  Appropriate  Thought Process:  Linear  Orientation:  Full (Time, Place, and Person)  Thought Content:  Paranoid Ideation and Rumination  Suicidal Thoughts:  No  Homicidal Thoughts:  Yes.  without intent/plan  Memory:  Immediate;   Fair Recent;   Fair Remote;   Fair  Judgement:  Impaired  Insight:  Shallow  Psychomotor Activity:  Restlessness  Concentration:  Fair  Recall:  [employer] of Knowledge:Fair  Language: Fair  Akathisia:  No  Handed:  Right  AIMS (if indicated):     Assets:  Desire for Improvement  Sleep:     Cognition: WNL  ADL's:  Intact    COGNITIVE FEATURES THAT CONTRIBUTE TO RISK:  Closed-mindedness, Polarized thinking and Thought constriction (tunnel vision)    SUICIDE RISK:   Mild:  Suicidal ideation of limited frequency, intensity, duration, and specificity.  There are no identifiable plans, no associated intent, mild dysphoria and related symptoms, good self-control (both objective and subjective assessment), few other risk factors, and identifiable protective factors, including available and accessible social support.  PLAN OF CARE: Patient will benefit from inpatient treatment and stabilization.  Estimated length of stay is 5-7 days.  Reviewed past medical records,treatment plan.  Case discussed with Aggie NP - please see H&p.      I certify that inpatient services furnished can reasonably be expected to improve the patient's condition.   Catalea Labrecque, MD 10/18/2015, 4:38  PM
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
## 2015-10-18 NOTE — Progress Notes (Signed)
Specimen cup given to pt at 1420.

## 2015-10-19 DIAGNOSIS — F3162 Bipolar disorder, current episode mixed, moderate: Principal | ICD-10-CM

## 2015-10-19 MED ORDER — DIVALPROEX SODIUM ER 500 MG PO TB24
500.0000 mg | ORAL_TABLET | Freq: Every day | ORAL | Status: DC
  Administered 2015-10-20: 500 mg via ORAL
  Filled 2015-10-19 (×3): qty 1
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
## 2015-10-19 NOTE — Progress Notes (Signed)
Pt wallet given to Viann Fish at 1130 am on 10/19/15 at pt's request.

## 2015-10-19 NOTE — BHH Suicide Risk Assessment (Signed)
BHH INPATIENT:  Family/Significant Other Suicide Prevention Education  Suicide Prevention Education:  Patient Refusal for Family/Significant Other Suicide Prevention Education: The patient Eric Bentley has refused to provide written consent for family/significant other to be provided Family/Significant Other Suicide Prevention Education during admission and/or prior to discharge.  Physician notified.  Elaina Hoops 10/19/2015, 3:42 PM

## 2015-10-19 NOTE — Progress Notes (Signed)
D: Pt presents anxious on approach. Pt have flight of ideas, rapid, pressured speech and is fidgety. Pt reports decreased depression. 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 Pt denies suicidal thoughts this morning. Pt verbally contract for safety. Pt stated to Clinical research associate, "if I start having suicidal thoughts I will come to you because I will need five Ativan pills to calm me down". During med administration pt is overly excited to take Ativan to help him maintain control. Writer informed pt that Ativan is part of his withdrawal protocol.  A: Medications reviewed with pt. Medications administered as ordered per MD. Verbal support provided. Pt encouraged to attend groups. 15 minute checks performed for safety. R: Pt receptive to tx.

## 2015-10-19 NOTE — Progress Notes (Signed)
[facility] MD Progress Note  10/19/2015 8:20 PM Eric Bentley  MRN:  [id number] Subjective:   Patient reports he remains depressed and vaguely irritated. He is ruminative about work related issues, stating that his boss has made him work " really hard", and is now wanting him to return to work soon, when patient needs to focus on his own health. Denies medication side effects at this time. Objective : I have discussed case with treatment team and have met with patient . Patient is a 40 year old male ,  Presented to ED due to depression, suicidal ideations. 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 Reported abusing alcohol and cannabis . At this time presents depressed, dysphoric, irritable. Denies SI at present and denies any HI.  Not currently presenting with distal tremors,diaphoresis or any acute distress .  Principal Problem: Bipolar 1 disorder, mixed, moderate ([place]) Diagnosis:   Patient Active Problem List   Diagnosis Date Noted  . Alcohol use disorder, moderate, dependence ([place]) [F10.20] 10/18/2015  . Atypical chest pain [R07.89] 09/30/2015  . Cocaine abuse [F14.10] 07/01/2015  . Prediabetes [R73.03] 12/08/2013  . Chronic anxiety [F41.9] 12/08/2013  . Vitamin D deficiency [E55.9] 11/16/2013  . Medication management [Z79.899] 11/16/2013  . Essential hypertension [I10] 10/14/2013  . Hyperlipidemia [E78.2] 10/14/2013  . Bipolar 1 disorder, mixed, moderate ([place]) [F31.62] 10/14/2013   Total Time spent with patient: 20 minutes   Past Medical History:  Past Medical History  Diagnosis Date  . Hyperlipidemia   . Hypertension   . Depression   . Anxiety   . Unspecified essential hypertension 10/14/2013  . Mixed hyperlipidemia 10/14/2013  . Bipolar 1 disorder, mixed, moderate ([place]) 10/14/2013  . Atypical  chest pain    History reviewed. No pertinent past surgical history. Family History:  Family History  Problem Relation Age of Onset  . Hyperlipidemia Mother   . Hypertension Mother   . Heart disease Mother 54    fatal  . Early death Mother   . Depression Mother   . Cancer Sister     breast  . Depression Sister   . Heart disease Brother   . Hyperlipidemia Brother   . Hypertension Brother   . Stroke Brother 28    OBESE 400#  . Heart disease Paternal Grandfather   . Hyperlipidemia Paternal Grandfather   . Hypertension Paternal Grandfather     Social History:  History  Alcohol Use  . 0.0 oz/week  . 0 Sos[place]ski drinks or equivalent per week    Comment: occasional     History  Drug Use  . Yes  . Special: Cocaine, Marijuana    Comment: crack    Social History   Social History  . Marital Status: Single    Spouse Name: N/A  . Number of Children: N/A  . Years of Education: N/A   Social History Main Topics  . Smoking status: Current Every Day Smoker -- 0.50 packs/day for 15 years  . Smokeless tobacco: None  . Alcohol Use: 0.0 oz/week    0 Guizar drinks or equivalent per week     Comment: occasional  . Drug Use: Yes    Special: Cocaine, Marijuana     Comment: crack  . Sexual Activity: Not Asked   Other Topics Concern  . None   Social History Narrative   Additional  Social History:    Pain Medications: pt denies abuse - see PTA meds list Prescriptions: pt denies abuse - see PTA meds list Over the Counter: pt denies abuse - see PTA meds list History of alcohol / drug use?: Yes Negative Consequences of Use: Financial, Work / Youth worker, Personal relationships Name of Substance 1: alcohol 1 - Age of First Use: teenager 1 - Amount (size/oz): one 40 oz beer 1 - Frequency: every other day 1 - Duration: months 1 - Last Use / Amount: 10/15/15 - one 40 oz beer Name of Substance 2: marijuana 2 - Age of First Use: unknown 2 - Amount (size/oz): half a bud 2 - Frequency:  twice weekly 2 - Duration: months Name of Substance 3: cocaine - crack and powder 3 - Frequency: only when friends have it in their possession 3 - Last Use / Amount: 10/14/15 - one line  Sleep: improved   Appetite:   Fair   Current Medications: Current Facility-Administered Medications  Medication Dose Route Frequency Provider Last Rate Last Dose  . acetaminophen (TYLENOL) tablet 650 mg  650 mg Oral Q6H PRN Encarnacion Slates, NP      . alum & mag hydroxide-simeth (MAALOX/MYLANTA) 200-200-20 MG/5ML suspension 30 mL  30 mL Oral Q4H PRN Encarnacion Slates, NP      . ARIPiprazole (ABILIFY) tablet 5 mg  5 mg Oral QHS Ursula Alert, MD   5 mg at 10/18/15 2218  . benztropine (COGENTIN) tablet 0.5 mg  0.5 mg Oral QHS Ursula Alert, MD   0.5 mg at 10/18/15 2218  . fenofibrate tablet 54 mg  54 mg Oral Daily Encarnacion Slates, NP   54 mg at 10/19/15 1610  . hydrOXYzine (ATARAX/VISTARIL) tablet 25 mg  25 mg Oral Q6H PRN Encarnacion Slates, NP      . lamoTRIgine (LAMICTAL) tablet 25 mg  25 mg Oral QPM Ursula Alert, MD   25 mg at 10/19/15 1815  . lisinopril (PRINIVIL,ZESTRIL) tablet 20 mg  20 mg Oral Daily Encarnacion Slates, NP   20 mg at 10/19/15 9604  . loperamide (IMODIUM) capsule 2-4 mg  2-4 mg Oral PRN Encarnacion Slates, NP      . LORazepam (ATIVAN) tablet 1 mg  1 mg Oral Q6H PRN Encarnacion Slates, NP      . Derrill Memo ON 10/20/2015] LORazepam (ATIVAN) tablet 1 mg  1 mg Oral BID Encarnacion Slates, NP       Followed by  . [START ON 10/21/2015] LORazepam (ATIVAN) tablet 1 mg  1 mg Oral Daily Encarnacion Slates, NP      . magnesium hydroxide (MILK OF MAGNESIA) suspension 30 mL  30 mL Oral Daily PRN Encarnacion Slates, NP   30 mL at 10/19/15 1815  . meloxicam (MOBIC) tablet 15 mg  15 mg Oral Daily Encarnacion Slates, NP   15 mg at 10/19/15 5409  . multivitamin with minerals tablet 1 tablet  1 tablet Oral Daily Encarnacion Slates, NP   1 tablet at 10/19/15 [phone]  . nicotine (NICODERM CQ - dosed in mg/24 hours) patch 21 mg  21 mg Transdermal Q0600 Encarnacion Slates, NP   21 mg at 10/19/15 0817  . ondansetron (ZOFRAN-ODT) disintegrating tablet 4 mg  4 mg Oral Q6H PRN Encarnacion Slates, NP      . pantoprazole (PROTONIX) EC tablet 80 mg  80 mg Oral Daily Encarnacion Slates, NP   80 mg at 10/18/15 1421  .  thiamine (VITAMIN B-1) tablet 100 mg  100 mg Oral Daily Encarnacion Slates, NP   100 mg at 10/19/15 8242    Lab Results:  Results for orders placed or performed during the hospital encounter of 10/18/15 (from the past 48 hour(s))  Urine rapid drug screen (hosp performed)not at [facility]     Status: Abnormal   Collection Time: 10/18/15  6:47 PM  Result Value Ref Range   Opiates NONE DETECTED NONE DETECTED   Cocaine POSITIVE (A) NONE DETECTED   Benzodiazepines NONE DETECTED NONE DETECTED   Amphetamines NONE DETECTED NONE DETECTED   Tetrahydrocannabinol POSITIVE (A) NONE DETECTED   Barbiturates NONE DETECTED NONE DETECTED    Comment:        DRUG SCREEN FOR MEDICAL PURPOSES ONLY.  IF CONFIRMATION IS NEEDED FOR ANY PURPOSE, NOTIFY LAB WITHIN 5 DAYS.        LOWEST DETECTABLE LIMITS FOR URINE DRUG SCREEN Drug Class       Cutoff (ng/mL) Amphetamine      1000 Barbiturate      200 Benzodiazepine   353 Tricyclics       614 Opiates          300 Cocaine          300 THC              50 Performed at [facility]   Ethanol     Status: None   Collection Time: 10/18/15  6:53 PM  Result Value Ref Range   Alcohol, Ethyl (B) <5 <5 mg/dL    Comment:        LOWEST DETECTABLE LIMIT FOR SERUM ALCOHOL IS 5 mg/dL FOR MEDICAL PURPOSES ONLY Performed at [facility]   TSH     Status: None   Collection Time: 10/18/15  6:53 PM  Result Value Ref Range   TSH 1.067 0.350 - 4.500 uIU/mL    Comment: Performed at [facility]  Urinalysis, Routine w reflex microscopic (not at [facility])     Status: None   Collection Time: 10/18/15  7:27 PM  Result Value Ref Range   Color, Urine YELLOW YELLOW   APPearance CLEAR CLEAR   Specific  Gravity, Urine 1.005 1.005 - 1.030   pH 6.0 5.0 - 8.0   Glucose, UA NEGATIVE NEGATIVE mg/dL   Hgb urine dipstick NEGATIVE NEGATIVE   Bilirubin Urine NEGATIVE NEGATIVE   Ketones, ur NEGATIVE NEGATIVE mg/dL   Protein, ur NEGATIVE NEGATIVE mg/dL   Nitrite NEGATIVE NEGATIVE   Leukocytes, UA NEGATIVE NEGATIVE    Comment: MICROSCOPIC NOT DONE ON URINES WITH NEGATIVE PROTEIN, BLOOD, LEUKOCYTES, NITRITE, OR GLUCOSE <1000 mg/dL. Performed at [facility]     Blood Alcohol level:  Lab Results  Component Value Date   [facility] <5 10/18/2015   [facility] 204* 07/01/2015    Physical Findings: AIMS: Facial and Oral Movements Muscles of Facial Expression: None, normal Lips and Perioral Area: None, normal Jaw: None, normal Tongue: None, normal,Extremity Movements Upper (arms, wrists, hands, fingers): None, normal Lower (legs, knees, ankles, toes): None, normal, Trunk Movements Neck, shoulders, hips: None, normal, Overall Severity Severity of abnormal movements (highest score from questions above): None, normal Incapacitation due to abnormal movements: None, normal Patient's awareness of abnormal movements (rate only patient's report): No Awareness, Dental Status Current problems with teeth and/or dentures?: Yes Does patient usually wear dentures?: Yes  CIWA:  CIWA-Ar Total: 0 COWS:     Musculoskeletal: Strength & Muscle Tone:  within normal limits- no tremors, no diaphoresis, no restlessness  Gait & Station: normal Patient leans: N/A  Psychiatric Specialty Exam: ROS denies CP, no SOB, no vomiting  Blood pressure 149/95, pulse 103, temperature 98.7 F (37.1 C), temperature source Oral, resp. rate 16, height 5' 8.2" (1.732 m), weight 175 lb (79.379 kg).Body mass index is 26.46 kg/(m^2).  General Appearance: Fairly Groomed  Engineer, water::  Fair  Speech:  Normal Rate  Volume:  Normal  Mood:  Dysphoric/ depressed   Affect:  Constricted  Thought Process:  Linear  Orientation:   Other:  fully alert and attentive   Thought Content:  denies hallucinations, no delusions, not internally preoccupied, ruminative   Suicidal Thoughts:  No- at this time denies any suicidal ideations, and denies any self injurious ideations, contracts for safety on unit   Homicidal Thoughts:  No  Memory:   Recent and remote grossly intact    Judgement:  Good  Insight:  Fair  Psychomotor Activity:  Normal  Concentration:  Good  Recall:  Good  Fund of Knowledge:Good  Language: Good  Akathisia:  Negative  Handed:  Right  AIMS (if indicated):     Assets:  Communication Skills Desire for Improvement Resilience  ADL's:  Intact  Cognition: WNL  Sleep:  Number of Hours: 7  Assessment - patient presents dysphoric, irritable, depressed, sad. At this time denies ongoing SI. He reports history of Bipolar Disorder, and reports being on Lamictal and Risperidone, which helped but which he does not take regularly due to side effects. Reports history of explosiveness, angry outbursts. Of note, at this time not presenting with symptoms of alcohol WDL. We discussed medication options, agrees to Depakote ER trial- side effects and rationale discussed  Treatment Plan Summary: Daily contact with patient to assess and evaluate symptoms and progress in treatment, Medication management, Plan inpatient admission and medications as below Continue to encourage group and milieu participation to work on coping skills and symptom reduction Start Depakote ER 500 mgrs QHS for mood disorder, history of angry outbursts  Discontinue Lamictal, which patient was taking irregularly and which he reports not tolerating well . Continue Abilify 5 mgrs QDAY for mood disorder  D/C Cogentin Continue Ativan detox protocol to minimize risk of alcohol WDL  Neita Garnet, MD 10/19/2015, 8:20 PM

## 2015-10-19 NOTE — Progress Notes (Signed)
Recreation Therapy Notes  Animal-Assisted Activity (AAA) Program Checklist/Progress Notes Patient Eligibility Criteria Checklist & Daily Group note for Rec Tx Intervention  Date: 02.21.2017 Time: 2:45pm Location: [address]   AAA/T Program Assumption of Risk Form signed by Patient/ or Parent Legal Guardian yes  Patient is free of allergies or sever asthma yes  Patient reports no fear of animals yes  Patient reports no history of cruelty to animals yes  Patient understands his/her participation is voluntary yes  Patient washes hands before animal contact yes  Patient washes hands after animal contact yes  Behavioral Response: Appropriate, Engaged   Education: Charity fundraiser, Appropriate Animal Interaction   Education Outcome: Acknowledges education.   Clinical Observations/Feedback: Patient appropriately engaged with therapy dog, petting him appropriately and peers in session.    Marykay Lex Lashe Oliveira, LRT/CTRS  Jearl Klinefelter 10/19/2015 3:14 PM

## 2015-10-19 NOTE — [facility] Group Notes (Signed)
[facility] LCSW Group Therapy 10/19/2015 1:15 PM  Type of Therapy: Group Therapy- Feelings about Diagnosis  Pt did not attend, declined invitation.   Chad Cordial, LCSWA 10/19/2015 3:42 PM

## 2015-10-19 NOTE — Progress Notes (Signed)
[facility] Group Notes:  (Nursing/MHT/Case Management/Adjunct)  Date:  10/19/2015  Time:  2100 Type of Therapy:  wrap up group  Participation Level:  Active  Participation Quality:  Appropriate, Attentive, Sharing and Supportive  Affect:  Angry, Irritable and Labile  Cognitive:  Appropriate  Insight:  Improving  Engagement in Group:  Engaged  Modes of Intervention:  Clarification, Education and Support  Summary of Progress/Problems:  Pt shares that he has been able to sleep a lot and he is enjoying that because " I thought that is only what rich people got to do."  Pt is unhappy at his job, unhappy with his boss, and unhappy with how many hours he works. 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 Pt gets increasing angry as he shares his situation but continues to have a smile on his face. Pt explains that as" fake it until you make it."  Shelah Lewandowsky 10/19/2015, 10:14 PM

## 2015-10-19 NOTE — Tx Team (Signed)
Interdisciplinary Treatment Plan Update (Adult) Date: 10/19/2015   Date: 10/19/2015 3:30 PM  Progress in Treatment:  Attending groups: Yes, minimally Participating in groups: Yes  Taking medication as prescribed: Yes  Tolerating medication: Yes  Family/Significant othe contact made: No, Pt declines Patient understands diagnosis: Yes AEB seeking help with depression Discussing patient identified problems/goals with staff: Yes  Medical problems stabilized or resolved: Yes  Denies suicidal/homicidal ideation: Yes Patient has not harmed self or Others: Yes   New problem(s) identified: None identified at this time.   Discharge Plan or Barriers: Pt will return home and follow-up with [facility].  Additional comments:  Patient and CSW reviewed pt's identified goals and treatment plan. Patient verbalized understanding and agreed to treatment plan. CSW reviewed [facility] "Discharge Process and Patient Involvement" Form. Pt verbalized understanding of information provided and signed form.   Reason for Continuation of Hospitalization:  Anxiety Depression Medical Issues Medication stabilization Suicidal ideation Withdrawal symptoms Psychotic  Estimated length of stay: 3-5 days  Review of initial/current patient goals per problem list:   1.  Goal(s): Patient will participate in aftercare plan  Met:  Yes  Target date: 3-5 days from date of admission   As evidenced by: Patient will participate within aftercare plan AEB aftercare provider and housing plan at discharge being identified.   10/19/15: Pt discharging home and will follow-up with [facility].  2.  Goal (s): Patient will exhibit decreased depressive symptoms and suicidal ideations.  Met:  No  Target date: 3-5 days from date of admission   As evidenced by: Patient will utilize self rating of depression at 3 or below and demonstrate decreased signs of depression or be deemed stable for discharge by MD. 10/19/15: Pt  was admitted with symptoms of depression, rating 10/10. Pt continues to present with flat affect and depressive symptoms.  Pt will demonstrate decreased symptoms of depression and rate depression at 3/10 or lower prior to discharge.  3.  Goal(s): Patient will demonstrate decreased signs and symptoms of anxiety.  Met:  No  Target date: 3-5 days from date of admission   As evidenced by: Patient will utilize self rating of anxiety at 3 or below and demonstrated decreased signs of anxiety, or be deemed stable for discharge by MD 10/19/15: Pt was admitted with increased levels of anxiety and is currently rating those symptoms highly. Pt will demonstrated decreased symptoms of anxiety and rate it at 3/10 prior to d/c.  6. Goal (s):  Patient will demonstrate decreased signs of mania . Met:  No . Target date: 3-5 days from date of admission  . As evidenced by:  Patient demonstrate decreased signs of mania AEB decreased mood instability and demonstration of stable mood    -10/19/15: Pt is irritable and easily    agitated, however can be redirected.  Attendees:  Patient:    Family:    Physician: Dr. Parke Poisson, MD  10/19/2015 3:30 PM  Nursing: Lars Pinks, RN Case manager  10/19/2015 3:30 PM  Clinical Social Worker Peri Maris, [place] 10/19/2015 3:30 PM  Other: Tilden Fossa, [place] 10/19/2015 3:30 PM  Clinical: Darrol Angel, RN; Gaylan Gerold, RN 10/19/2015 3:30 PM  Other: , RN Charge Nurse 10/19/2015 3:30 PM  Other: Hilda Lias, [place], [place] Social Work [phone]

## 2015-10-20 MED ORDER — DIVALPROEX SODIUM ER 500 MG PO TB24
750.0000 mg | ORAL_TABLET | Freq: Every day | ORAL | Status: DC
  Administered 2015-10-21 – 2015-10-25 (×5): 750 mg via ORAL
  Filled 2015-10-20 (×6): qty 1

## 2015-10-20 NOTE — [facility] Group Notes (Signed)
[facility] LCSW Group Therapy 10/20/2015 1:15 PM  Type of Therapy: Group Therapy- Emotion Regulation  Pt did not attend, declined invitation.   Chad Cordial, LCSWA 10/20/2015 4:50 PM

## 2015-10-20 NOTE — Progress Notes (Signed)
D: Patient alert and oriented x 4. Patient denies pain/SI/HI/AVH. Patient came to Clinical research associate and asked for something to calm him down and then did a split in the hallway floor. Patient states, "I just feel like I have a bunch of pent up energy." PRN Vistaril given at 2138. Patient went to bed after taking.  A: Staff to monitor Q 15 mins for safety. Encouragement and support offered. Scheduled medications administered per orders. R: Patient remains safe on the unit. Patient attended group tonight. Patient visible on the unit and interacting with peers. Patient taking administered medications.

## 2015-10-20 NOTE — Progress Notes (Signed)
Adult Psychoeducational Group Note  Date:  10/20/2015 Time:  10:31 PM  Group Topic/Focus:  Wrap-Up Group:   The focus of this group is to help patients review their daily goal of treatment and discuss progress on daily workbooks.  Participation Level:  Active  Participation Quality:  Appropriate  Affect:  Appropriate  Cognitive:  Alert  Insight: Appropriate  Engagement in Group:  Engaged  Modes of Intervention:  Discussion  Additional Comments:  Patient goal for today was to get a piece of mind and to share a few good laughs.On a scale between 1-10, (1=worse, 10=best) patient rated his day an 8.  Arieal Cuoco L Denay Pleitez 10/20/2015, 10:31 PM

## 2015-10-20 NOTE — Progress Notes (Signed)
Patient ID: Eric Bentley, male   DOB: 06/29/1976, 40 y.o.   MRN: [id number] [facility] MD Progress Note  10/20/2015 4:18 PM Eric Bentley  MRN:  [id number] Subjective:   Patient reports ongoing sense of depression and dysphoria, vague irritability. He does report some improvement compared to admission and today seems less ruminative about his job and employer, which he was focused on yesterday. At this time states that he plans to get another job when he is discharged rather than return to his old job, because " the stress is not good for me". Thus far denies any medication side effects. Objective : I have discussed case with treatment team and have met with patient . Currently not presenting with symptoms of alcohol WDL- no tremors, no diaphoresis, no  Restlessness . Limited group participation, tends to isolate.  As discussed with staff, remains irritable and dysphoric, at times verbally loud, using foul language , but not exhibiting any threatening or violent behavior Today presents somewhat less irritable and dysphoric, with a fuller range of affect, smiles appropriately at times. We reviewed labs, and patient expressed significant relief that they were generally unremarkable, normal, as he had been worried and ruminative about having " all sorts of trouble with my heart, my liver".    Principal Problem: Bipolar 1 disorder, mixed, moderate ([place]) Diagnosis:   Patient Active Problem List   Diagnosis Date Noted  . Alcohol use disorder, moderate, dependence ([place]) [F10.20] 10/18/2015  . Atypical chest pain [R07.89] 09/30/2015  . Cocaine abuse [F14.10] 07/01/2015  . Prediabetes [R73.03] 12/08/2013  . Chronic anxiety [F41.9] 12/08/2013  . Vitamin D deficiency [E55.9] 11/16/2013  . Medication management [Z79.899] 11/16/2013  . Essential hypertension [I10] 10/14/2013  . Hyperlipidemia [E78.2] 10/14/2013  . Bipolar 1 disorder, mixed, moderate ([place]) [F31.62] 10/14/2013   Total Time spent with  patient: 20 minutes   Past Medical History:  Past Medical History  Diagnosis Date  . Hyperlipidemia   . Hypertension   . Depression   . Anxiety   . Unspecified essential hypertension 10/14/2013  . Mixed hyperlipidemia 10/14/2013  . Bipolar 1 disorder, mixed, moderate ([place]) 10/14/2013  . Atypical chest pain    History reviewed. No pertinent past surgical history. Family History:  Family History  Problem Relation Age of Onset  . Hyperlipidemia Mother   . Hypertension Mother   . Heart disease Mother 78    fatal  . Early death Mother   . Depression Mother   . Cancer Sister     breast  . Depression Sister   . Heart disease Brother   . Hyperlipidemia Brother   . Hypertension Brother   . Stroke Brother 28    OBESE 400#  . Heart disease Paternal Grandfather   . Hyperlipidemia Paternal Grandfather   . Hypertension Paternal Grandfather     Social History:  History  Alcohol Use  . 0.0 oz/week  . 0 [place] drinks or equivalent per week    Comment: occasional     History  Drug Use  . Yes  . Special: Cocaine, Marijuana    Comment: crack    Social History   Social History  . Marital Status: Single    Spouse Name: N/A  . Number of Children: N/A  . Years of Education: N/A   Social History Main Topics  . Smoking status: Current Every Day Smoker -- 0.50 packs/day for 15 years  . Smokeless tobacco: None  . Alcohol Use: 0.0 oz/week    0 Bost drinks or  equivalent per week     Comment: occasional  . Drug Use: Yes    Special: Cocaine, Marijuana     Comment: crack  . Sexual Activity: Not Asked   Other Topics Concern  . None   Social History Narrative   Additional Social History:    Pain Medications: pt denies abuse - see PTA meds list Prescriptions: pt denies abuse - see PTA meds list Over the Counter: pt denies abuse - see PTA meds list History of alcohol / drug use?: Yes Negative Consequences of Use: Financial, Work / Youth worker, Personal relationships Name of  Substance 1: alcohol 1 - Age of First Use: teenager 1 - Amount (size/oz): one 40 oz beer 1 - Frequency: every other day 1 - Duration: months 1 - Last Use / Amount: 10/15/15 - one 40 oz beer Name of Substance 2: marijuana 2 - Age of First Use: unknown 2 - Amount (size/oz): half a bud 2 - Frequency: twice weekly 2 - Duration: months Name of Substance 3: cocaine - crack and powder 3 - Frequency: only when friends have it in their possession 3 - Last Use / Amount: 10/14/15 - one line  Sleep: improved   Appetite:   Improved    Current Medications: Current Facility-Administered Medications  Medication Dose Route Frequency Provider Last Rate Last Dose  . acetaminophen (TYLENOL) tablet 650 mg  650 mg Oral Q6H PRN Encarnacion Slates, NP      . alum & mag hydroxide-simeth (MAALOX/MYLANTA) 200-200-20 MG/5ML suspension 30 mL  30 mL Oral Q4H PRN Encarnacion Slates, NP      . ARIPiprazole (ABILIFY) tablet 5 mg  5 mg Oral QHS Ursula Alert, MD   5 mg at 10/19/15 2138  . divalproex (DEPAKOTE ER) 24 hr tablet 500 mg  500 mg Oral Daily Jenne Campus, MD   500 mg at 10/20/15 0819  . fenofibrate tablet 54 mg  54 mg Oral Daily Encarnacion Slates, NP   54 mg at 10/20/15 0818  . hydrOXYzine (ATARAX/VISTARIL) tablet 25 mg  25 mg Oral Q6H PRN Encarnacion Slates, NP   25 mg at 10/19/15 2138  . lisinopril (PRINIVIL,ZESTRIL) tablet 20 mg  20 mg Oral Daily Encarnacion Slates, NP   20 mg at 10/20/15 0819  . loperamide (IMODIUM) capsule 2-4 mg  2-4 mg Oral PRN Encarnacion Slates, NP      . LORazepam (ATIVAN) tablet 1 mg  1 mg Oral Q6H PRN Encarnacion Slates, NP      . LORazepam (ATIVAN) tablet 1 mg  1 mg Oral BID Encarnacion Slates, NP   1 mg at 10/20/15 3016   Followed by  . [START ON 10/21/2015] LORazepam (ATIVAN) tablet 1 mg  1 mg Oral Daily Encarnacion Slates, NP      . magnesium hydroxide (MILK OF MAGNESIA) suspension 30 mL  30 mL Oral Daily PRN Encarnacion Slates, NP   30 mL at 10/20/15 0609  . meloxicam (MOBIC) tablet 15 mg  15 mg Oral Daily Encarnacion Slates, NP   15 mg at 10/20/15 0818  . multivitamin with minerals tablet 1 tablet  1 tablet Oral Daily Encarnacion Slates, NP   1 tablet at 10/20/15 0818  . nicotine (NICODERM CQ - dosed in mg/24 hours) patch 21 mg  21 mg Transdermal Q0600 Encarnacion Slates, NP   21 mg at 10/20/15 0109  . ondansetron (ZOFRAN-ODT) disintegrating tablet 4 mg  4 mg Oral Q6H  PRN Encarnacion Slates, NP      . pantoprazole (PROTONIX) EC tablet 80 mg  80 mg Oral Daily Encarnacion Slates, NP   80 mg at 10/20/15 0820  . thiamine (VITAMIN B-1) tablet 100 mg  100 mg Oral Daily Encarnacion Slates, NP   100 mg at 10/20/15 2025    Lab Results:  Results for orders placed or performed during the hospital encounter of 10/18/15 (from the past 48 hour(s))  Urine rapid drug screen (hosp performed)not at [facility]     Status: Abnormal   Collection Time: 10/18/15  6:47 PM  Result Value Ref Range   Opiates NONE DETECTED NONE DETECTED   Cocaine POSITIVE (A) NONE DETECTED   Benzodiazepines NONE DETECTED NONE DETECTED   Amphetamines NONE DETECTED NONE DETECTED   Tetrahydrocannabinol POSITIVE (A) NONE DETECTED   Barbiturates NONE DETECTED NONE DETECTED    Comment:        DRUG SCREEN FOR MEDICAL PURPOSES ONLY.  IF CONFIRMATION IS NEEDED FOR ANY PURPOSE, NOTIFY LAB WITHIN 5 DAYS.        LOWEST DETECTABLE LIMITS FOR URINE DRUG SCREEN Drug Class       Cutoff (ng/mL) Amphetamine      1000 Barbiturate      200 Benzodiazepine   427 Tricyclics       062 Opiates          300 Cocaine          300 THC              50 Performed at [facility]   Ethanol     Status: None   Collection Time: 10/18/15  6:53 PM  Result Value Ref Range   Alcohol, Ethyl (B) <5 <5 mg/dL    Comment:        LOWEST DETECTABLE LIMIT FOR SERUM ALCOHOL IS 5 mg/dL FOR MEDICAL PURPOSES ONLY Performed at [facility]   TSH     Status: None   Collection Time: 10/18/15  6:53 PM  Result Value Ref Range   TSH 1.067 0.350 - 4.500 uIU/mL    Comment:  Performed at [facility]  Urinalysis, Routine w reflex microscopic (not at [facility])     Status: None   Collection Time: 10/18/15  7:27 PM  Result Value Ref Range   Color, Urine YELLOW YELLOW   APPearance CLEAR CLEAR   Specific Gravity, Urine 1.005 1.005 - 1.030   pH 6.0 5.0 - 8.0   Glucose, UA NEGATIVE NEGATIVE mg/dL   Hgb urine dipstick NEGATIVE NEGATIVE   Bilirubin Urine NEGATIVE NEGATIVE   Ketones, ur NEGATIVE NEGATIVE mg/dL   Protein, ur NEGATIVE NEGATIVE mg/dL   Nitrite NEGATIVE NEGATIVE   Leukocytes, UA NEGATIVE NEGATIVE    Comment: MICROSCOPIC NOT DONE ON URINES WITH NEGATIVE PROTEIN, BLOOD, LEUKOCYTES, NITRITE, OR GLUCOSE <1000 mg/dL. Performed at [facility]     Blood Alcohol level:  Lab Results  Component Value Date   [facility] <5 10/18/2015   [facility] 204* 07/01/2015    Physical Findings: AIMS: Facial and Oral Movements Muscles of Facial Expression: None, normal Lips and Perioral Area: None, normal Jaw: None, normal Tongue: None, normal,Extremity Movements Upper (arms, wrists, hands, fingers): None, normal Lower (legs, knees, ankles, toes): None, normal, Trunk Movements Neck, shoulders, hips: None, normal, Overall Severity Severity of abnormal movements (highest score from questions above): None, normal Incapacitation due to abnormal movements: None, normal Patient's awareness of abnormal movements (rate only  patient's report): No Awareness, Dental Status Current problems with teeth and/or dentures?: Yes Does patient usually wear dentures?: Yes  CIWA:  CIWA-Ar Total: 1 COWS:     Musculoskeletal: Strength & Muscle Tone: within normal limits- no tremors, no diaphoresis, no restlessness  Gait & Station: normal Patient leans: N/A  Psychiatric Specialty Exam: ROS denies CP, no SOB, no vomiting  Blood pressure 120/96, pulse 104, temperature 97.7 F (36.5 C), temperature source Oral, resp. rate 16, height 5' 8.2" (1.732 m), weight 175 lb  (79.379 kg).Body mass index is 26.46 kg/(m^2).  General Appearance:  Improved grooming   Eye Contact::  Improved   Speech:  Normal Rate  Volume:  Variable, loud at times   Mood: less dysphoric  Affect:  Constricted, still vaguely irritable at times   Thought Process:  Linear  Orientation:  Other:  fully alert and attentive   Thought Content:  denies hallucinations, no delusions, not internally preoccupied, ruminative   Suicidal Thoughts:  No- at this time denies any suicidal ideations, and denies any self injurious ideations, contracts for safety on unit   Homicidal Thoughts:  No  Memory:   Recent and remote grossly intact    Judgement:  Good  Insight:  Fair  Psychomotor Activity:  Normal  Concentration:  Good  Recall:  Good  Fund of Knowledge:Good  Language: Good  Akathisia:  Negative  Handed:  Right  AIMS (if indicated):     Assets:  Communication Skills Desire for Improvement Resilience  ADL's:  Intact  Cognition: WNL  Sleep:  Number of Hours: 6.25  Assessment - patient presents less irritable, less dysphoric, but continues to present with some dysphoria, and at times becomes angry , loud, particularly when talking about life stressors. No grossly disruptive behaviors on unit. At present not presenting with significant alcohol WDL symptoms. Thus far tolerating Abilify and Depakote ER well .  Treatment Plan Summary: Daily contact with patient to assess and evaluate symptoms and progress in treatment, Medication management, Plan inpatient admission and medications as below Continue to encourage group and milieu participation to work on coping skills and symptom reduction Increase Depakote ER to 750  mgrs QHS for mood disorder, history of angry outbursts  Continue Abilify 5 mgrs QDAY for mood disorder  Continue Ativan detox protocol to minimize risk of alcohol WDL  Neita Garnet, MD 10/20/2015, 4:18 PM

## 2015-10-20 NOTE — Progress Notes (Signed)
DAR NOTE: Pt present with flat affect and depressed mood in the unit. Pt has been in the milieu interacting with peers and staff.Pt denies physical pain, took all his meds as scheduled. As per self inventory, pt had a poor night sleep, good appetite, normal energy, and poor concentration. Pt rate depression at 0, hopeless ness at 0, and anxiety at 6 Pt gaol for today is "trying to get my positive attitude back."  Pt's safety ensured with 15 minute and environmental checks. Pt currently denies SI/HI and A/V hallucinations. Pt verbally agrees to seek staff if SI/HI or A/VH occurs and to consult with staff before acting on these thoughts. Will continue POC.

## 2015-10-20 NOTE — Progress Notes (Signed)
Recreation Therapy Notes  Date: 02.22.2017 Time: 9:30am Location: [address] Group Room   Group Topic: Stress Management  Goal Area(s) Addresses:  Patient will actively participate in stress management techniques presented during session.   Behavioral Response: Did not attend.   Renu Asby L Antoine Vandermeulen, LRT/CTRS        Hodges Treiber L 10/20/2015 10:20 AM 

## 2015-10-21 MED ORDER — ARIPIPRAZOLE 10 MG PO TABS
10.0000 mg | ORAL_TABLET | Freq: Every day | ORAL | Status: DC
  Administered 2015-10-21: 10 mg via ORAL
  Filled 2015-10-21 (×4): qty 1

## 2015-10-21 NOTE — Progress Notes (Signed)
Nursing Note: 0700-1900  D:  Pt presents with depressed mood and sad affect.  "I feel really sad today, I have needed to cry in my room at times.  Pt rates depression 7/10, hopelessness 5/10 and anxiety 4/10 today." Pt c/o "nervousness, my insides are racing and I have a headache."  A:  Encouraged to verbalize needs and concerns, active listening and support provided.  Continued Q 15 minute safety checks.  Tylenol and Vistaril given as ordered prn for withdrawal symptoms.  R:  Pt. denies A/V hallucinations and is able to verbally contract for safety.  PRN meds effective in managing symptoms. 1800:  Pt tells this RN "I feel so much better, that medicine made me feel like myself, I don't feel like everybody is judging me.  Please tell the Doctor that the medicine helped me a lot."  Noted since receiving Vistaril, the med has been discontinued.

## 2015-10-21 NOTE — Progress Notes (Signed)
Adult Psychoeducational Group Note  Date:  10/21/2015 Time:  10:07 PM  Group Topic/Focus:  Wrap-Up Group:   The focus of this group is to help patients review their daily goal of treatment and discuss progress on daily workbooks.   Participation Level:  Active  Participation Quality:  Appropriate  Affect:  Appropriate  Cognitive:  Alert  Insight: Appropriate  Engagement in Group:  Engaged  Modes of Intervention:  Discussion  Additional Comments:  Patient stated his day started off brutal. Patient stated he has been depressed and paranoid. Patient also stated that he got his meds straight and was able to sleep well. Patient goal for today was to make sure all his medication work before he discharge.   Eric Bentley [name] 10/21/2015, 10:07 PM

## 2015-10-21 NOTE — Progress Notes (Signed)
D: Patient alert and oriented x 4. Patient denies pain/SI/HI/AVH. Patient requested vistaril at bedtime to help his anxiety. PRN dose given.  A: Staff to monitor Q 15 mins for safety. Encouragement and support offered. Scheduled medications administered per orders. R: Patient remains safe on the unit. Patient attended group tonight. Patient visible on the unit and interacting with peers. Patient taking administered medications.

## 2015-10-21 NOTE — [facility] Group Notes (Signed)
[facility] Group Notes:  (Nursing/MHT/Case Management/Adjunct)  Date:  10/21/2015  Time:  12:46 PM  Type of Therapy:  Psychoeducational Skills , Nurse Education  Participation Level:  Minimal  Participation Quality:  Attentive while present in group, left half way through.  Affect:  Depressed  Cognitive:  Alert and Appropriate  Insight:  Limited  Engagement in Group:  Pt was attentive but did not speak.  Modes of Intervention:  Activity, Discussion, Education, Exploration and Problem-solving  Summary of Progress/Problems:  Discussed Leisure and Lifestyle Changes to aid with stress management.  Karren Burly 10/21/2015, 12:46 PM

## 2015-10-21 NOTE — [facility] Group Notes (Signed)
[facility] LCSW Group Therapy 10/21/2015 1:15pm  Type of Therapy: Group Therapy- [facility]  Pt did not attend, declined invitation.   Chad Cordial, LCSWA 10/21/2015 3:49 PM

## 2015-10-21 NOTE — Progress Notes (Addendum)
Patient ID: Eric Bentley, male   DOB: [birth year], 40 y.o.   MRN: [id number] [facility] MD Progress Note  10/21/2015 2:40 PM Majestic Brister  MRN:  [id number] Subjective:   Patient reports feeling more depressed and vaguely irritable today. Cannot identify any stressors or issues to explain this - states " I guess my mood just goes up and down". At this time does endorse a vague sense of being " jittery", but does not appear to be in any acute distress or discomfort. He denies any medication side effects. Of note, states that in addition to alcohol he had been abusing cocaine recently.  Objective : I have discussed case with treatment team and have met with patient . Currently not presenting with  tremors,  diaphoresis, or psychomotor agitation /restlessness - some tachycardia earlier today, BP WNL.  Today presents with increased dysphoria, depression, and reports feeling vaguely irritable, due to which he has been " keeping to myself today". He was pleasant on approach, and affect improved partially during session. Limited group participation today. He is future oriented and states he is planning on returning to work , but at another job- does not want to return to old job as feels it was worsening his symptoms.    Principal Problem: Bipolar 1 disorder, mixed, moderate ([place]) Diagnosis:   Patient Active Problem List   Diagnosis Date Noted  . Alcohol use disorder, moderate, dependence ([place]) [F10.20] 10/18/2015  . Atypical chest pain [R07.89] 09/30/2015  . Cocaine abuse [F14.10] 07/01/2015  . Prediabetes [R73.03] 12/08/2013  . Chronic anxiety [F41.9] 12/08/2013  . Vitamin D deficiency [E55.9] 11/16/2013  . Medication management [Z79.899] 11/16/2013  . Essential hypertension [I10] 10/14/2013  . Hyperlipidemia [E78.2] 10/14/2013  . Bipolar 1 disorder, mixed, moderate ([place]) [F31.62] 10/14/2013   Total Time spent with patient: 20 minutes   Past Medical History:  Past Medical History  Diagnosis  Date  . Hyperlipidemia   . Hypertension   . Depression   . Anxiety   . Unspecified essential hypertension 10/14/2013  . Mixed hyperlipidemia 10/14/2013  . Bipolar 1 disorder, mixed, moderate ([place]) 10/14/2013  . Atypical chest pain    History reviewed. No pertinent past surgical history. Family History:  Family History  Problem Relation Age of Onset  . Hyperlipidemia Mother   . Hypertension Mother   . Heart disease Mother 80    fatal  . Early death Mother   . Depression Mother   . Cancer Sister     breast  . Depression Sister   . Heart disease Brother   . Hyperlipidemia Brother   . Hypertension Brother   . Stroke Brother 28    OBESE 400#  . Heart disease Paternal Grandfather   . Hyperlipidemia Paternal Grandfather   . Hypertension Paternal Grandfather     Social History:  History  Alcohol Use  . 0.0 oz/week  . 0 Fries drinks or equivalent per week    Comment: occasional     History  Drug Use  . Yes  . Special: Cocaine, Marijuana    Comment: crack    Social History   Social History  . Marital Status: Single    Spouse Name: N/A  . Number of Children: N/A  . Years of Education: N/A   Social History Main Topics  . Smoking status: Current Every Day Smoker -- 0.50 packs/day for 15 years  . Smokeless tobacco: None  . Alcohol Use: 0.0 oz/week    0 Gottsch drinks or equivalent per week  Comment: occasional  . Drug Use: Yes    Special: Cocaine, Marijuana     Comment: crack  . Sexual Activity: Not Asked   Other Topics Concern  . None   Social History Narrative   Additional Social History:    Pain Medications: pt denies abuse - see PTA meds list Prescriptions: pt denies abuse - see PTA meds list Over the Counter: pt denies abuse - see PTA meds list History of alcohol / drug use?: Yes Negative Consequences of Use: Financial, Work / Youth worker, Personal relationships Name of Substance 1: alcohol 1 - Age of First Use: teenager 1 - Amount (size/oz): one 40  oz beer 1 - Frequency: every other day 1 - Duration: months 1 - Last Use / Amount: 10/15/15 - one 40 oz beer Name of Substance 2: marijuana 2 - Age of First Use: unknown 2 - Amount (size/oz): half a bud 2 - Frequency: twice weekly 2 - Duration: months Name of Substance 3: cocaine - crack and powder 3 - Frequency: only when friends have it in their possession 3 - Last Use / Amount: 10/14/15 - one line  Sleep: improved   Appetite:   Improved    Current Medications: Current Facility-Administered Medications  Medication Dose Route Frequency Provider Last Rate Last Dose  . acetaminophen (TYLENOL) tablet 650 mg  650 mg Oral Q6H PRN Encarnacion Slates, NP   650 mg at 10/21/15 1348  . alum & mag hydroxide-simeth (MAALOX/MYLANTA) 200-200-20 MG/5ML suspension 30 mL  30 mL Oral Q4H PRN Encarnacion Slates, NP      . ARIPiprazole (ABILIFY) tablet 5 mg  5 mg Oral QHS Ursula Alert, MD   5 mg at 10/20/15 2131  . divalproex (DEPAKOTE ER) 24 hr tablet 750 mg  750 mg Oral Daily Jenne Campus, MD   750 mg at 10/21/15 0826  . fenofibrate tablet 54 mg  54 mg Oral Daily Encarnacion Slates, NP   54 mg at 10/21/15 0827  . lisinopril (PRINIVIL,ZESTRIL) tablet 20 mg  20 mg Oral Daily Encarnacion Slates, NP   20 mg at 10/21/15 4696  . magnesium hydroxide (MILK OF MAGNESIA) suspension 30 mL  30 mL Oral Daily PRN Encarnacion Slates, NP   30 mL at 10/20/15 0609  . meloxicam (MOBIC) tablet 15 mg  15 mg Oral Daily Encarnacion Slates, NP   15 mg at 10/21/15 2952  . multivitamin with minerals tablet 1 tablet  1 tablet Oral Daily Encarnacion Slates, NP   1 tablet at 10/21/15 0826  . nicotine (NICODERM CQ - dosed in mg/24 hours) patch 21 mg  21 mg Transdermal Q0600 Encarnacion Slates, NP   21 mg at 10/21/15 0617  . pantoprazole (PROTONIX) EC tablet 80 mg  80 mg Oral Daily Encarnacion Slates, NP   80 mg at 10/21/15 8413  . thiamine (VITAMIN B-1) tablet 100 mg  100 mg Oral Daily Encarnacion Slates, NP   100 mg at 10/21/15 2440    Lab Results:  No results found for  this or any previous visit (from the past 48 hour(s)).  Blood Alcohol level:  Lab Results  Component Value Date   [facility] <5 10/18/2015   ETH 204* 07/01/2015    Physical Findings: AIMS: Facial and Oral Movements Muscles of Facial Expression: None, normal Lips and Perioral Area: None, normal Jaw: None, normal Tongue: None, normal,Extremity Movements Upper (arms, wrists, hands, fingers): None, normal Lower (legs, knees, ankles, toes): None, normal,  Trunk Movements Neck, shoulders, hips: None, normal, Overall Severity Severity of abnormal movements (highest score from questions above): None, normal Incapacitation due to abnormal movements: None, normal Patient's awareness of abnormal movements (rate only patient's report): No Awareness, Dental Status Current problems with teeth and/or dentures?: Yes Does patient usually wear dentures?: Yes  CIWA:  CIWA-Ar Total: 5 COWS:     Musculoskeletal: Strength & Muscle Tone: within normal limits- no tremors, no diaphoresis, no restlessness  Gait & Station: normal Patient leans: N/A  Psychiatric Specialty Exam: ROS denies CP, no SOB, no vomiting  Blood pressure 113/82, pulse 120, temperature 98.5 F (36.9 C), temperature source Oral, resp. rate 16, height 5' 8.2" (1.732 m), weight 175 lb (79.379 kg).Body mass index is 26.46 kg/(m^2).  General Appearance:  Improved grooming   Eye Contact::  Improved   Speech:  Normal Rate  Volume:   Normal    Mood:  Increased depression, dysphoria today  Affect:  Constricted, vaguely irritable  Thought Process:  Linear  Orientation:  Other:  fully alert and attentive   Thought Content:  denies hallucinations, no delusions, not internally preoccupied, ruminative   Suicidal Thoughts:  No- at this time denies any suicidal ideations, and denies any self injurious ideations, contracts for safety on unit   Homicidal Thoughts:  No  Memory:   Recent and remote grossly intact    Judgement:  Good  Insight:  Fair   Psychomotor Activity:  Normal  Concentration:  Good  Recall:  Good  Fund of Knowledge:Good  Language: Good  Akathisia:  Negative  Handed:  Right  AIMS (if indicated):     Assets:  Communication Skills Desire for Improvement Resilience  ADL's:  Intact  Cognition: WNL  Sleep:  Number of Hours: 6.5  Assessment - patient reports ongoing sense of irritability , dysphoria, vague subjective sense of anger, but no restlessness or agitation,  No pressured speech. No  grossly disruptive behaviors on unit. He is not   presenting with significant  alcohol WDL symptoms. Thus far tolerating Abilify and Depakote ER well .  Treatment Plan Summary: Daily contact with patient to assess and evaluate symptoms and progress in treatment, Medication management, Plan inpatient admission and medications as below Continue to encourage group and milieu participation to work on coping skills and symptom reduction Increase Depakote ER to 750  mgrs QHS for mood disorder, history of angry outbursts  Increase  Abilify to 10  mgrs QDAY for mood disorder  Continue Ativan detox protocol to minimize risk of alcohol WDL  Neita Garnet, MD 10/21/2015, 2:40 PM

## 2015-10-22 MED ORDER — HYDROXYZINE HCL 25 MG PO TABS
25.0000 mg | ORAL_TABLET | Freq: Four times a day (QID) | ORAL | Status: DC | PRN
  Administered 2015-10-22 – 2015-10-26 (×11): 25 mg via ORAL
  Filled 2015-10-22 (×10): qty 1

## 2015-10-22 MED ORDER — HYDROXYZINE HCL 25 MG PO TABS
ORAL_TABLET | ORAL | Status: AC
  Filled 2015-10-22: qty 1

## 2015-10-22 MED ORDER — QUETIAPINE FUMARATE 50 MG PO TABS
50.0000 mg | ORAL_TABLET | Freq: Every day | ORAL | Status: DC
  Administered 2015-10-22 – 2015-10-25 (×4): 50 mg via ORAL
  Filled 2015-10-22 (×6): qty 1

## 2015-10-22 NOTE — [facility] Group Notes (Signed)
[facility] LCSW Group Therapy 10/22/2015 1:15pm  Type of Therapy: Group Therapy- Feelings Around Relapse and Recovery  Participation Level: Minimal   Participation Quality:  Reserved  Affect:  Appropriate  Cognitive: Alert and Oriented   Insight:  Developing   Engagement in Therapy: Minimal  Modes of Intervention: Clarification, Confrontation, Discussion, Education, Exploration, Limit-setting, Orientation, Problem-solving, Rapport Building, Dance movement psychotherapist, Socialization and Support  Summary of Progress/Problems: The topic for today was feelings about relapse. The group discussed what relapse prevention is to them and identified triggers that they are on the path to relapse. Members also processed their feeling towards relapse and were able to relate to common experiences. Group also discussed coping skills that can be used for relapse prevention.  Pt was attentive to group discussion but did not participate.    Therapeutic Modalities:   Cognitive Behavioral Therapy Solution-Focused Therapy Assertiveness Training Relapse Prevention Therapy    Lamar Sprinkles [phone] 10/22/2015 4:08 PM

## 2015-10-22 NOTE — Tx Team (Signed)
Interdisciplinary Treatment Plan Update (Adult) Date: 10/22/2015   Date: 10/22/2015 1:29 PM  Progress in Treatment:  Attending groups: Minimally Participating in groups: No Taking medication as prescribed: Yes  Tolerating medication: Yes  Family/Significant othe contact made: No, Pt declines Patient understands diagnosis: Yes AEB seeking help with depression Discussing patient identified problems/goals with staff: Yes  Medical problems stabilized or resolved: Yes  Denies suicidal/homicidal ideation: Yes Patient has not harmed self or Others: Yes   New problem(s) identified: None identified at this time.   Discharge Plan or Barriers: Pt will return home and follow-up with [facility].  Additional comments:  Patient and CSW reviewed pt's identified goals and treatment plan. Patient verbalized understanding and agreed to treatment plan. CSW reviewed [facility] "Discharge Process and Patient Involvement" Form. Pt verbalized understanding of information provided and signed form.   Reason for Continuation of Hospitalization:  Anxiety Depression Medication stabilization Suicidal ideation Withdrawal symptoms   Estimated length of stay: 2-3 days  Review of initial/current patient goals per problem list:   1.  Goal(s): Patient will participate in aftercare plan  Met:  Yes  Target date: 3-5 days from date of admission   As evidenced by: Patient will participate within aftercare plan AEB aftercare provider and housing plan at discharge being identified.   10/19/15: Pt discharging home and will follow-up with [facility].  2.  Goal (s): Patient will exhibit decreased depressive symptoms and suicidal ideations.  Met:  Progressing  Target date: 3-5 days from date of admission   As evidenced by: Patient will utilize self rating of depression at 3 or below and demonstrate decreased signs of depression or be deemed stable for discharge by MD. 10/19/15: Pt was admitted with  symptoms of depression, rating 10/10. Pt continues to present with flat affect and depressive symptoms.  Pt will demonstrate decreased symptoms of depression and rate depression at 3/10 or lower prior to discharge. 10/22/15: Pt is reporting improvement in depression levels; denies SI  3.  Goal(s): Patient will demonstrate decreased signs and symptoms of anxiety.  Met:  No  Target date: 3-5 days from date of admission   As evidenced by: Patient will utilize self rating of anxiety at 3 or below and demonstrated decreased signs of anxiety, or be deemed stable for discharge by MD 10/19/15: Pt was admitted with increased levels of anxiety and is currently rating those symptoms highly. Pt will demonstrated decreased symptoms of anxiety and rate it at 3/10 prior to d/c. 10/22/15: Pt continues to report high levels of anxiety. 4. Goal(s): Patient will demonstrate decreased signs and symptoms of withdrawal related to substance abuse.  Met: No  Target Date: 3-5 days from date of admission  As evidenced by: CIWA or COWS score of 0 or be deemed stable for discharge by MD.  10/22/15: Pt CIWA score of 6 and endorses tremor, disorientation, and anxiety as symptoms of withdrawal.  6. Goal (s):  Patient will demonstrate decreased signs of mania . Met:  Yes . Target date: 3-5 days from date of admission  . As evidenced by:  Patient demonstrate decreased signs of mania AEB decreased mood instability and demonstration of stable mood    -10/19/15: Pt is irritable and easily    agitated, however can be redirected.    -10/22/15: Pt less irritable and agitated.    Mood more stable  Attendees:  Patient:    Family:    Physician: Dr. Parke Poisson, MD  10/22/2015 1:29 PM  Nursing: Lars Pinks,  RN Case manager  10/22/2015 1:29 PM  Clinical Social Worker Peri Maris, [place] 10/22/2015 1:29 PM  Other: Tilden Fossa, LCSWA 10/22/2015 1:29 PM  Clinical: Maureen Chatters, RN; Sandre Kitty, RN 10/22/2015 1:29 PM  Other: ,  RN Charge Nurse 10/22/2015 1:29 PM  Other: Hilda Lias, [place], [place] Social Work [phone]

## 2015-10-22 NOTE — [facility] Group Notes (Signed)
[facility] LCSW Aftercare Discharge Planning Group Note  10/22/2015 8:45 AM  Participation Quality: Alert, Appropriate and Oriented  Mood/Affect: Appropriate  Depression Rating: "lower"  Anxiety Rating: "higher"  Thoughts of Suicide: Pt denies SI/HI  Will you contract for safety? Yes  Current AVH: Pt denies  Plan for Discharge/Comments: Pt attended discharge planning group and actively participated in group. CSW discussed suicide prevention education with the group and encouraged them to discuss discharge planning and any relevant barriers. Pt expresses that his anxiety is high today due to a discontinuation of his anxiety medication.   Transportation Means: Pt reports access to transportation  Supports: No supports mentioned at this time  Chad Cordial, LCSWA 10/22/2015 1:00 PM

## 2015-10-22 NOTE — Progress Notes (Addendum)
Eric Bentley  Is seen OOB UAL on the [address] hall and he tolerates this well. His biggest complaint is his high anxiety lveel.  He completed his daily assessment this morning and on it he wrote  He denied SI nd he rated his depression, hopelessness and anxiety " 2/2/3", respectively. He requested  Prn  vistaril at 1323 and stated " it did help a little".    A He attends his groups and is educated about his med changes today as well as his upcoming lab work tomorrow.   R Safety is in place and poc cont.

## 2015-10-22 NOTE — Progress Notes (Signed)
Eric Bentley attended group tonight. He is very conversant with this Clinical research associate. He rates Anxiety 2-3/10 Depression 1/10. States he wants to make sure he does not have to come back after he is discharged. "I don't care how long I have to stay as long as I can feel better when I leave here and not come back". Denies SI/HI/AVH at this time. Contracts for safety. Encouragement and support given. Medications administered as prescribed. Continue Q 15 minute checks for patient safety and medication effectiveness.

## 2015-10-22 NOTE — Progress Notes (Signed)
Recreation Therapy Notes  Date: 02.24.2017 Time: 9:30am Location: [address] Hall Group Room  Group Topic: Stress Management  Goal Area(s) Addresses:  Patient will actively participate in stress management techniques presented during session.   Behavioral Response: Did not attend.   Leland Staszewski L Sapphira Harjo, LRT/CTRS  Rc Amison L 10/22/2015 10:15 AM 

## 2015-10-22 NOTE — Progress Notes (Signed)
Patient ID: Eric Bentley, male   DOB: Nov 28, 1975, 40 y.o.   MRN: [id number] [facility] MD Progress Note  10/22/2015 4:22 PM Eric Bentley  MRN:  [id number] Subjective:   Patient reports his mood is still depressed, but does feel better than on admission. He reports ongoing anxiety, and feels he is more anxious . He states " I don't feel ready to discharge yet, I feel I am still a mess ". Denies medication side effects. Reports cravings for alcohol and for cocaine , which he attributes partly to his anxiety. Objective : I have discussed case with treatment team and have met with patient . Patient's group attendance has increased and he has been more visible on the unit, although presenting somewhat reserved and depressed. Denies medication side effects. We discussed medications and he is interested in changing to Seroquel, as he has heard this medication may be more helpful for insomnia and for night time anxiety.  Focused on not feeling ready for discharge yet.  Contracts for safety on the unit. Mood reported as still depressed, but partially improved. Less irritability, and no loud or explosive behaviors on unit. Patient has reported that in addition to alcohol he had been using cocaine regularly , and feels this drug use contributed to his decompensation- we have reviewed importance of maintaining focus on sobriety and relapse prevention    Principal Problem: Bipolar 1 disorder, mixed, moderate ([place]) Diagnosis:   Patient Active Problem List   Diagnosis Date Noted  . Alcohol use disorder, moderate, dependence ([place]) [F10.20] 10/18/2015  . Atypical chest pain [R07.89] 09/30/2015  . Cocaine abuse [F14.10] 07/01/2015  . Prediabetes [R73.03] 12/08/2013  . Chronic anxiety [F41.9] 12/08/2013  . Vitamin D deficiency [E55.9] 11/16/2013  . Medication management [Z79.899] 11/16/2013  . Essential hypertension [I10] 10/14/2013  . Hyperlipidemia [E78.2] 10/14/2013  . Bipolar 1 disorder, mixed, moderate  ([place]) [F31.62] 10/14/2013   Total Time spent with patient: 20 minutes   Past Medical History:  Past Medical History  Diagnosis Date  . Hyperlipidemia   . Hypertension   . Depression   . Anxiety   . Unspecified essential hypertension 10/14/2013  . Mixed hyperlipidemia 10/14/2013  . Bipolar 1 disorder, mixed, moderate ([place]) 10/14/2013  . Atypical chest pain    History reviewed. No pertinent past surgical history. Family History:  Family History  Problem Relation Age of Onset  . Hyperlipidemia Mother   . Hypertension Mother   . Heart disease Mother 68    fatal  . Early death Mother   . Depression Mother   . Cancer Sister     breast  . Depression Sister   . Heart disease Brother   . Hyperlipidemia Brother   . Hypertension Brother   . Stroke Brother 28    OBESE 400#  . Heart disease Paternal Grandfather   . Hyperlipidemia Paternal Grandfather   . Hypertension Paternal Grandfather     Social History:  History  Alcohol Use  . 0.0 oz/week  . 0 [place] drinks or equivalent per week    Comment: occasional     History  Drug Use  . Yes  . Special: Cocaine, Marijuana    Comment: crack    Social History   Social History  . Marital Status: Single    Spouse Name: N/A  . Number of Children: N/A  . Years of Education: N/A   Social History Main Topics  . Smoking status: Current Every Day Smoker -- 0.50 packs/day for 15 years  . Smokeless  tobacco: None  . Alcohol Use: 0.0 oz/week    0 [place] drinks or equivalent per week     Comment: occasional  . Drug Use: Yes    Special: Cocaine, Marijuana     Comment: crack  . Sexual Activity: Not Asked   Other Topics Concern  . None   Social History Narrative   Additional Social History:    Pain Medications: pt denies abuse - see PTA meds list Prescriptions: pt denies abuse - see PTA meds list Over the Counter: pt denies abuse - see PTA meds list History of alcohol / drug use?: Yes Negative Consequences of Use:  Financial, Work / Youth worker, Personal relationships Name of Substance 1: alcohol 1 - Age of First Use: teenager 1 - Amount (size/oz): one 40 oz beer 1 - Frequency: every other day 1 - Duration: months 1 - Last Use / Amount: 10/15/15 - one 40 oz beer Name of Substance 2: marijuana 2 - Age of First Use: unknown 2 - Amount (size/oz): half a bud 2 - Frequency: twice weekly 2 - Duration: months Name of Substance 3: cocaine - crack and powder 3 - Frequency: only when friends have it in their possession 3 - Last Use / Amount: 10/14/15 - one line  Sleep:  Fair   Appetite:   Improved    Current Medications: Current Facility-Administered Medications  Medication Dose Route Frequency Provider Last Rate Last Dose  . acetaminophen (TYLENOL) tablet 650 mg  650 mg Oral Q6H PRN Encarnacion Slates, NP   650 mg at 10/21/15 1348  . alum & mag hydroxide-simeth (MAALOX/MYLANTA) 200-200-20 MG/5ML suspension 30 mL  30 mL Oral Q4H PRN Encarnacion Slates, NP      . divalproex (DEPAKOTE ER) 24 hr tablet 750 mg  750 mg Oral Daily Jenne Campus, MD   750 mg at 10/22/15 0947  . fenofibrate tablet 54 mg  54 mg Oral Daily Encarnacion Slates, NP   54 mg at 10/22/15 0813  . hydrOXYzine (ATARAX/VISTARIL) 25 MG tablet           . hydrOXYzine (ATARAX/VISTARIL) tablet 25 mg  25 mg Oral Q6H PRN Jenne Campus, MD   25 mg at 10/22/15 1323  . lisinopril (PRINIVIL,ZESTRIL) tablet 20 mg  20 mg Oral Daily Encarnacion Slates, NP   20 mg at 10/22/15 0962  . magnesium hydroxide (MILK OF MAGNESIA) suspension 30 mL  30 mL Oral Daily PRN Encarnacion Slates, NP   30 mL at 10/20/15 0609  . meloxicam (MOBIC) tablet 15 mg  15 mg Oral Daily Encarnacion Slates, NP   15 mg at 10/22/15 8366  . multivitamin with minerals tablet 1 tablet  1 tablet Oral Daily Encarnacion Slates, NP   1 tablet at 10/22/15 0813  . nicotine (NICODERM CQ - dosed in mg/24 hours) patch 21 mg  21 mg Transdermal Q0600 Encarnacion Slates, NP   21 mg at 10/22/15 0630  . pantoprazole (PROTONIX) EC tablet 80  mg  80 mg Oral Daily Encarnacion Slates, NP   80 mg at 10/21/15 2947  . QUEtiapine (SEROQUEL) tablet 50 mg  50 mg Oral QHS Brentton Wardlow A Tejah Brekke, MD      . thiamine (VITAMIN B-1) tablet 100 mg  100 mg Oral Daily Encarnacion Slates, NP   100 mg at 10/22/15 6546    Lab Results:  No results found for this or any previous visit (from the past 48 hour(s)).  Blood Alcohol  level:  Lab Results  Component Value Date   ETH <5 10/18/2015   ETH 204* 07/01/2015    Physical Findings: AIMS: Facial and Oral Movements Muscles of Facial Expression: None, normal Lips and Perioral Area: None, normal Jaw: None, normal Tongue: None, normal,Extremity Movements Upper (arms, wrists, hands, fingers): None, normal Lower (legs, knees, ankles, toes): None, normal, Trunk Movements Neck, shoulders, hips: None, normal, Overall Severity Severity of abnormal movements (highest score from questions above): None, normal Incapacitation due to abnormal movements: None, normal Patient's awareness of abnormal movements (rate only patient's report): No Awareness, Dental Status Current problems with teeth and/or dentures?: Yes Does patient usually wear dentures?: Yes  CIWA:  CIWA-Ar Total: 6 COWS:     Musculoskeletal: Strength & Muscle Tone: within normal limits- no tremors, no diaphoresis, no restlessness  Gait & Station: normal Patient leans: N/A  Psychiatric Specialty Exam: ROS denies CP, no SOB, no vomiting  Blood pressure 109/80, pulse 98, temperature 97.9 F (36.6 C), temperature source Oral, resp. rate 16, height 5' 8.2" (1.732 m), weight 175 lb (79.379 kg).Body mass index is 26.46 kg/(m^2).  General Appearance:  Improved grooming   Eye Contact::  Improved   Speech:  Normal Rate  Volume:   Normal    Mood:  Reports ongoing depression, partially improved.  Affect:   Less constricted, not irritable or angry today, anxious   Thought Process:  Linear  Orientation:  Other:  fully alert and attentive   Thought Content:   denies hallucinations, no delusions, not internally preoccupied, ruminative   Suicidal Thoughts:  No- at this time denies any suicidal ideations, and denies any self injurious ideations, contracts for safety on unit   Homicidal Thoughts:  No  Memory:   Recent and remote grossly intact    Judgement:  Good  Insight:  Fair  Psychomotor Activity:  Normal  Concentration:  Good  Recall:  Good  Fund of Knowledge:Good  Language: Good  Akathisia:  Negative  Handed:  Right  AIMS (if indicated):     Assets:  Communication Skills Desire for Improvement Resilience  ADL's:  Intact  Cognition: WNL  Sleep:  Number of Hours: 5  Assessment - patient reports improving mood, less severe depression, but reports ongoing and at times worsened anxiety. Behavior on unit in good control. Gradually increasing group participation.  Reports cravings for drugs ( cocaine ) , but motivated in recovery at this time Tolerating Depakote ER and Abilify well thus far, but interested in Seroquel trial, to address mood disorder, insomnia, and night time anxiety- side effects discussed    Treatment Plan Summary: Daily contact with patient to assess and evaluate symptoms and progress in treatment, Medication management, Plan inpatient admission and medications as below Continue to encourage group and milieu participation to work on coping skills and symptom reduction Continue  Depakote ER  750  mgrs QHS for mood disorder, history of angry outbursts  D/C Abilify Start Seroquel 50 mgrs QHS for mood disorder and insomnia  Hydroxyzine 25 mgrs Q 6 hours PRN for anxiety , as needed  Continue Ativan detox protocol to minimize risk of alcohol WDL  Neita Garnet, MD 10/22/2015, 4:22 PM

## 2015-10-23 LAB — VALPROIC ACID LEVEL: VALPROIC ACID LVL: 23 ug/mL — AB (ref 50.0–100.0)

## 2015-10-23 NOTE — Progress Notes (Signed)
Eric Bentley states the groups are somewhat boring however he still attends them regularly. He rates Anxiety 6/10 Depression 0/10. Anxiety still appears to be his main concern regarding discharge and maintaining a stable mood. Denies SI/HI/AVH. Encouragement and support given. Medications administered as prescribed. Continue Q 15 minute checks for patient safety and medication effectiveness.

## 2015-10-23 NOTE — Progress Notes (Signed)
Eric Bentley is  OOB UAL on the 400 hall today. HE tolerates this well. HE is compliant with his medications and he interacts well with his peers. He completes his daily assessment and on it he wrote he deneid SI today and he rates his depression, hopelessness and anxiety " 0/0/2", respectively. A He is interactive and engaged in the discussion in his Life SKills group today. He shares that he is beginning to see how some of his unhealthy behaviors have impacted his life and he is beginning to " undestand I have a choice"..influenza how I live my life. R Safety is in place and poc cont.

## 2015-10-23 NOTE — Progress Notes (Signed)
[facility] MD Progress Note  10/23/2015 4:55 PM Randy Castrejon  MRN:  [id number] Subjective:   Patient reports" I feel wonderful today. This is a great place but I am ready to go"  Objective : Kyjuan Gabbert is awake, alert and oriented X4 , found attending group session.  Denies suicidal or homicidal ideation. Denies auditory or visual hallucination and does not appear to be responding to internal stimuli. Patient interacts well with staff and others. Patient reports he is medication compliant without mediation side effects.  States his depression 2/10. Patient states "I am having a wonderful day, and I like the new medications that I was started on." Reports a good appetite and is resting well. Patient reports he is ready to be discharge on Tuesday. Support, encouragement and reassurance was provided.     Principal Problem: Bipolar 1 disorder, mixed, moderate ([facility]) Diagnosis:   Patient Active Problem List   Diagnosis Date Noted  . Alcohol use disorder, moderate, dependence ([facility]) [F10.20] 10/18/2015  . Atypical chest pain [R07.89] 09/30/2015  . Cocaine abuse [F14.10] 07/01/2015  . Prediabetes [R73.03] 12/08/2013  . Chronic anxiety [F41.9] 12/08/2013  . Vitamin D deficiency [E55.9] 11/16/2013  . Medication management [Z79.899] 11/16/2013  . Essential hypertension [I10] 10/14/2013  . Hyperlipidemia [E78.2] 10/14/2013  . Bipolar 1 disorder, mixed, moderate ([facility]) [F31.62] 10/14/2013   Total Time spent with patient: 20 minutes   Past Medical History:  Past Medical History  Diagnosis Date  . Hyperlipidemia   . Hypertension   . Depression   . Anxiety   . Unspecified essential hypertension 10/14/2013  . Mixed hyperlipidemia 10/14/2013  . Bipolar 1 disorder, mixed, moderate ([facility]) 10/14/2013  . Atypical chest pain    History reviewed. No pertinent past surgical history. Family History:  Family History  Problem Relation Age of Onset  . Hyperlipidemia Mother   . Hypertension Mother   . Heart  disease Mother 64    fatal  . Early death Mother   . Depression Mother   . Cancer Sister     breast  . Depression Sister   . Heart disease Brother   . Hyperlipidemia Brother   . Hypertension Brother   . Stroke Brother 28    OBESE 400#  . Heart disease Paternal Grandfather   . Hyperlipidemia Paternal Grandfather   . Hypertension Paternal Grandfather     Social History:  History  Alcohol Use  . 0.0 oz/week  . 0 [place] drinks or equivalent per week    Comment: occasional     History  Drug Use  . Yes  . Special: Cocaine, Marijuana    Comment: crack    Social History   Social History  . Marital Status: Single    Spouse Name: N/A  . Number of Children: N/A  . Years of Education: N/A   Social History Main Topics  . Smoking status: Current Every Day Smoker -- 0.50 packs/day for 15 years  . Smokeless tobacco: None  . Alcohol Use: 0.0 oz/week    0 [place] drinks or equivalent per week     Comment: occasional  . Drug Use: Yes    Special: Cocaine, Marijuana     Comment: crack  . Sexual Activity: Not Asked   Other Topics Concern  . None   Social History Narrative   Additional Social History:    Pain Medications: pt denies abuse - see PTA meds list Prescriptions: pt denies abuse - see PTA meds list Over the Counter: pt denies abuse -  see PTA meds list History of alcohol / drug use?: Yes Negative Consequences of Use: Financial, Work / Programmer, multimedia, Personal relationships Name of Substance 1: alcohol 1 - Age of First Use: teenager 1 - Amount (size/oz): one 40 oz beer 1 - Frequency: every other day 1 - Duration: months 1 - Last Use / Amount: 10/15/15 - one 40 oz beer Name of Substance 2: marijuana 2 - Age of First Use: unknown 2 - Amount (size/oz): half a bud 2 - Frequency: twice weekly 2 - Duration: months Name of Substance 3: cocaine - crack and powder 3 - Frequency: only when friends have it in their possession 3 - Last Use / Amount: 10/14/15 - one  line  Sleep:  Fair   Appetite:   Improved    Current Medications: Current Facility-Administered Medications  Medication Dose Route Frequency Provider Last Rate Last Dose  . acetaminophen (TYLENOL) tablet 650 mg  650 mg Oral Q6H PRN Sanjuana Kava, NP   650 mg at 10/23/15 1450  . alum & mag hydroxide-simeth (MAALOX/MYLANTA) 200-200-20 MG/5ML suspension 30 mL  30 mL Oral Q4H PRN Sanjuana Kava, NP      . divalproex (DEPAKOTE ER) 24 hr tablet 750 mg  750 mg Oral Daily Craige Cotta, MD   750 mg at 10/23/15 0839  . fenofibrate tablet 54 mg  54 mg Oral Daily Sanjuana Kava, NP   54 mg at 10/23/15 0839  . hydrOXYzine (ATARAX/VISTARIL) tablet 25 mg  25 mg Oral Q6H PRN Craige Cotta, MD   25 mg at 10/23/15 1450  . lisinopril (PRINIVIL,ZESTRIL) tablet 20 mg  20 mg Oral Daily Sanjuana Kava, NP   20 mg at 10/23/15 0839  . magnesium hydroxide (MILK OF MAGNESIA) suspension 30 mL  30 mL Oral Daily PRN Sanjuana Kava, NP   30 mL at 10/20/15 0609  . meloxicam (MOBIC) tablet 15 mg  15 mg Oral Daily Sanjuana Kava, NP   15 mg at 10/23/15 0839  . multivitamin with minerals tablet 1 tablet  1 tablet Oral Daily Sanjuana Kava, NP   1 tablet at 10/23/15 [phone]  . nicotine (NICODERM CQ - dosed in mg/24 hours) patch 21 mg  21 mg Transdermal Q0600 Sanjuana Kava, NP   21 mg at 10/23/15 9811  . pantoprazole (PROTONIX) EC tablet 80 mg  80 mg Oral Daily Sanjuana Kava, NP   80 mg at 10/23/15 0839  . QUEtiapine (SEROQUEL) tablet 50 mg  50 mg Oral QHS Craige Cotta, MD   50 mg at 10/22/15 2114  . thiamine (VITAMIN B-1) tablet 100 mg  100 mg Oral Daily Sanjuana Kava, NP   100 mg at 10/23/15 9147    Lab Results:  Results for orders placed or performed during the hospital encounter of 10/18/15 (from the past 48 hour(s))  Valproic acid level     Status: Abnormal   Collection Time: 10/23/15  6:30 AM  Result Value Ref Range   Valproic Acid Lvl 23 (L) 50.0 - 100.0 ug/mL    Comment: Performed at [facility]    Blood Alcohol level:  Lab Results  Component Value Date   [facility] <5 10/18/2015   ETH 204* 07/01/2015    Physical Findings: AIMS: Facial and Oral Movements Muscles of Facial Expression: None, normal Lips and Perioral Area: None, normal Jaw: None, normal Tongue: None, normal,Extremity Movements Upper (arms, wrists, hands, fingers): None, normal Lower (legs, knees, ankles,  toes): None, normal, Trunk Movements Neck, shoulders, hips: None, normal, Overall Severity Severity of abnormal movements (highest score from questions above): None, normal Incapacitation due to abnormal movements: None, normal Patient's awareness of abnormal movements (rate only patient's report): No Awareness, Dental Status Current problems with teeth and/or dentures?: Yes Does patient usually wear dentures?: Yes  CIWA:  CIWA-Ar Total: 1 COWS:     Musculoskeletal: Strength & Muscle Tone: within normal limits- no tremors, no diaphoresis, no restlessness  Gait & Station: normal Patient leans: N/A  Psychiatric Specialty Exam: Review of Systems  Psychiatric/Behavioral: Negative for depression and suicidal ideas. The patient is nervous/anxious and has insomnia.   All other systems reviewed and are negative.  denies CP, no SOB, no vomiting  Blood pressure 103/80, pulse 111, temperature 97.6 F (36.4 C), temperature source Oral, resp. rate 18, height 5' 8.2" (1.732 m), weight 79.379 kg (175 lb).Body mass index is 26.46 kg/(m^2).  General Appearance: casual   Eye Contact::  Improved   Speech:  Normal Rate  Volume:   Normal    Mood:  Reports ongoing depression, partially improved.  Affect:   Less constricted, not irritable or angry today, anxious   Thought Process:  Linear  Orientation:  Full (Time, Place, and Person)  Thought Content:  denies hallucinations, no delusions, not internally preoccupied, ruminative   Suicidal Thoughts:  No- at this time denies any suicidal ideations, and denies any self  injurious ideations, contracts for safety on unit   Homicidal Thoughts:  No  Memory:   Recent and remote grossly intact    Judgement:  Good  Insight:  Fair  Psychomotor Activity:  Normal  Concentration:  Good  Recall:  Good  Fund of Knowledge:Good  Language: Good  Akathisia:  No  Handed:  Right  AIMS (if indicated):     Assets:  Communication Skills Desire for Improvement Resilience  ADL's:  Intact  Cognition: WNL  Sleep:  Number of Hours: 5     I agree with current treatment plan on 10/23/2015, Patient seen face-to-face for psychiatric evaluation follow-up, chart reviewed. Reviewed the information documented and agree with the treatment plan.  Treatment Plan Summary: Daily contact with patient to assess and evaluate symptoms and progress in treatment, Medication management, Plan inpatient admission and medications as below  Continue to encourage group and milieu participation to work on coping skills and symptom reduction Continue  Depakote ER  750  mgrs QHS for mood disorder, history of angry outbursts  D/C Abilify Continue Seroquel 50 mgrs QHS for mood disorder and insomnia   Continue Hydroxyzine 25 mgrs Q 6 hours PRN for anxiety , as needed  Continue Ativan detox protocol to minimize risk of alcohol WDL   Oneta Rack, NP 10/23/2015, 4:55 PM

## 2015-10-23 NOTE — Progress Notes (Signed)
D.  Pt pleasant on approach, denies complaints at this time.  Positive for evening wrap up group, interacting appropriately with peers on the unit.  Denies SI/HI/hallucinations at this time.  A.  Support and encouragement offered, medication given as ordered.  R.  Pt remain safe on the unit, will continue to monitor.

## 2015-10-23 NOTE — Progress Notes (Signed)
Adult Psychoeducational Group Note  Date:  10/23/2015 Time:  8:51 PM  Group Topic/Focus:  Wrap-Up Group:   The focus of this group is to help patients review their daily goal of treatment and discuss progress on daily workbooks.  Participation Level:  Active  Participation Quality:  Appropriate  Affect:  Appropriate  Cognitive:  Appropriate  Insight: Appropriate  Engagement in Group:  Engaged  Modes of Intervention:  Discussion  Additional Comments:  Pt was joking a lot in group stating his goal was to keep his cup filled with coffee. Pt stated tomorrow his goal was to act his age and not his IQ. Pt stated on a positive note he had no depression or mood swings today and he can really feel the medications starting to help.  Caswell Corwin 10/23/2015, 8:51 PM

## 2015-10-23 NOTE — [facility] Group Notes (Signed)
10/23/2015  10:00 AM   Type of Therapy and Topic: Group Therapy: Preventing Self Sabotage   Participation Level: Engaged well with group today. Willing to participate with support from facilitator.   Description of Group:   Group discussed self-sabotage. Patient identified familiarity with the concept of self-sabotage and desire to stop this process. Patient identified that the idea of giving too much to others was central to their challenges with self-sabotage. Discussed concept of 'selfish' vs 'self-care'. Patients could identify cultural history of desires not to be 'selfish' because of bad connotation, but need for 'self-care'. Group also discussed the use of coping skills in order to prevent self-sabotage and encourage better methods of self-care.   Therapeutic Goals Addressed in Processing Group:               1)  Identify self-sabotage and it's roots from the influence of others.             2)  Acknowledge that self-sabotage impacts everyone differently.             3)  Acknowledge that giving too much of self can prevent 'self-care'.              4)  Identify coping skills to help redirect self-sabotage and negative self-talk.  Summary of Patient Progress:   Patient was very engaged and shared openly in group. Key concept was the importance of boundaries in self care so that others don't give the opportunity to practice self sabotaging behaviors.   Beverly Sessions MSW, LCSW

## 2015-10-24 NOTE — [facility] Group Notes (Signed)
10/24/2015  10:00 AM   Type of Therapy and Topic: Group Therapy: Developing Self Esteem and Improving Support System   Participation Level: Engaged well with group today. Willing to participate with support from facilitator.   Description of Group:   Participants chose group topic based on their identified challenges. Participants discussed challenges of poor self-esteem and asked questions to gain better understanding of the issue. Group discussion processed roots of self-esteem in order to identify a plan to change work on self-esteem.   Therapeutic Goals Addressed in Processing Group:               1)  Identify self-esteem and how it is influenced of others.             2)  Acknowledge the importance of being honest with yourself about feelings and self-worth.             3)  Identify supports that will affirm your esteem.             4)  Identify activities to acceptance of self.    Summary of Patient Progress:   Patient was engaged in group and shared the importance of self esteem when changing networks and supports to decrease feeling used by others.   Beverly Sessions MSW, LCSW

## 2015-10-24 NOTE — Progress Notes (Signed)
Adult Psychoeducational Group Note  Date:  10/24/2015 Time:  9:46 PM  Group Topic/Focus:  Wrap-Up Group:   The focus of this group is to help patients review their daily goal of treatment and discuss progress on daily workbooks.  Participation Level:  Active  Participation Quality:  Appropriate and Attentive  Affect:  Appropriate  Cognitive:  Appropriate  Insight: Appropriate and Good  Engagement in Group:  Engaged  Modes of Intervention:  Discussion  Additional Comments:  Pt rated his day a 4 out of 10. Pt day has been laid back but he feel he did not get anything accomplished. Pt goal for tomorrow is to go home.   Merlinda Frederick 10/24/2015, 9:46 PM

## 2015-10-24 NOTE — [facility] Group Notes (Addendum)
[facility] Group Notes:  (Nursing/MHT/Case Management/Adjunct)  Date:  10/23/2015  Time:  9:35 AM  Type of Therapy:  Nurse Education  /  Life SKills : The group is focused on teaching patietns how to identify their needs.  Participation Level:  Active  Participation Quality:  Attentive  Affect:  Appropriate  Cognitive:  Alert  Insight:  Appropriate  Engagement in Group:  Engaged  Modes of Intervention:  Clarification  Summary of Progress/Problems:  Rich Brave 10/24/2015, 9:35 AM  Late entry

## 2015-10-24 NOTE — Progress Notes (Signed)
[facility] MD Progress Note  10/24/2015 3:04 PM Alamin Mccuiston  MRN:  [id number]  Subjective:   Patient reports" I can't complain today. Its been a goodtime here. I don't want to leave"  Objective : Ihor Wertheim is awake, alert and oriented X4 , found attending group session.  Patient is walking with a limp,reportes chronic lower back and hip pain. Denies suicidal or homicidal ideation. Denies auditory or visual hallucination and does not appear to be responding to internal stimuli. Patient interacts well with staff and others. Patient reports he is medication compliant without mediation side effects.  States his depression 2/10. Patient states "I am having a good time and I don't want to leave here". Reports a good appetite and is resting well. Patient reports he is ready to be discharge on Tuesday. States" I should have all my ducks in a row by then." Patient reports blurred vision and needing to get is eyewear. Support, encouragement and reassurance was provided.     Principal Problem: Bipolar 1 disorder, mixed, moderate (HCC) Diagnosis:   Patient Active Problem List   Diagnosis Date Noted  . Alcohol use disorder, moderate, dependence (HCC) [F10.20] 10/18/2015  . Atypical chest pain [R07.89] 09/30/2015  . Cocaine abuse [F14.10] 07/01/2015  . Prediabetes [R73.03] 12/08/2013  . Chronic anxiety [F41.9] 12/08/2013  . Vitamin D deficiency [E55.9] 11/16/2013  . Medication management [Z79.899] 11/16/2013  . Essential hypertension [I10] 10/14/2013  . Hyperlipidemia [E78.2] 10/14/2013  . Bipolar 1 disorder, mixed, moderate (HCC) [F31.62] 10/14/2013   Total Time spent with patient: 20 minutes   Past Medical History:  Past Medical History  Diagnosis Date  . Hyperlipidemia   . Hypertension   . Depression   . Anxiety   . Unspecified essential hypertension 10/14/2013  . Mixed hyperlipidemia 10/14/2013  . Bipolar 1 disorder, mixed, moderate (HCC) 10/14/2013  . Atypical chest pain    History  reviewed. No pertinent past surgical history. Family History:  Family History  Problem Relation Age of Onset  . Hyperlipidemia Mother   . Hypertension Mother   . Heart disease Mother 24    fatal  . Early death Mother   . Depression Mother   . Cancer Sister     breast  . Depression Sister   . Heart disease Brother   . Hyperlipidemia Brother   . Hypertension Brother   . Stroke Brother 28    OBESE 400#  . Heart disease Paternal Grandfather   . Hyperlipidemia Paternal Grandfather   . Hypertension Paternal Grandfather     Social History:  History  Alcohol Use  . 0.0 oz/week  . 0 [place] drinks or equivalent per week    Comment: occasional     History  Drug Use  . Yes  . Special: Cocaine, Marijuana    Comment: crack    Social History   Social History  . Marital Status: Single    Spouse Name: N/A  . Number of Children: N/A  . Years of Education: N/A   Social History Main Topics  . Smoking status: Current Every Day Smoker -- 0.50 packs/day for 15 years  . Smokeless tobacco: None  . Alcohol Use: 0.0 oz/week    0 [place] drinks or equivalent per week     Comment: occasional  . Drug Use: Yes    Special: Cocaine, Marijuana     Comment: crack  . Sexual Activity: Not Asked   Other Topics Concern  . None   Social History Narrative   Additional Social  History:    Pain Medications: pt denies abuse - see PTA meds list Prescriptions: pt denies abuse - see PTA meds list Over the Counter: pt denies abuse - see PTA meds list History of alcohol / drug use?: Yes Negative Consequences of Use: Financial, Work / Programmer, multimedia, Personal relationships Name of Substance 1: alcohol 1 - Age of First Use: teenager 1 - Amount (size/oz): one 40 oz beer 1 - Frequency: every other day 1 - Duration: months 1 - Last Use / Amount: 10/15/15 - one 40 oz beer Name of Substance 2: marijuana 2 - Age of First Use: unknown 2 - Amount (size/oz): half a bud 2 - Frequency: twice weekly 2 -  Duration: months Name of Substance 3: cocaine - crack and powder 3 - Frequency: only when friends have it in their possession 3 - Last Use / Amount: 10/14/15 - one line  Sleep:  Fair   Appetite:   Improved    Current Medications: Current Facility-Administered Medications  Medication Dose Route Frequency Provider Last Rate Last Dose  . acetaminophen (TYLENOL) tablet 650 mg  650 mg Oral Q6H PRN Sanjuana Kava, NP   650 mg at 10/24/15 1001  . alum & mag hydroxide-simeth (MAALOX/MYLANTA) 200-200-20 MG/5ML suspension 30 mL  30 mL Oral Q4H PRN Sanjuana Kava, NP      . divalproex (DEPAKOTE ER) 24 hr tablet 750 mg  750 mg Oral Daily Craige Cotta, MD   750 mg at 10/24/15 0808  . fenofibrate tablet 54 mg  54 mg Oral Daily Sanjuana Kava, NP   54 mg at 10/24/15 0809  . hydrOXYzine (ATARAX/VISTARIL) tablet 25 mg  25 mg Oral Q6H PRN Craige Cotta, MD   25 mg at 10/24/15 0815  . lisinopril (PRINIVIL,ZESTRIL) tablet 20 mg  20 mg Oral Daily Sanjuana Kava, NP   20 mg at 10/24/15 0809  . magnesium hydroxide (MILK OF MAGNESIA) suspension 30 mL  30 mL Oral Daily PRN Sanjuana Kava, NP   30 mL at 10/20/15 0609  . meloxicam (MOBIC) tablet 15 mg  15 mg Oral Daily Sanjuana Kava, NP   15 mg at 10/24/15 0809  . multivitamin with minerals tablet 1 tablet  1 tablet Oral Daily Sanjuana Kava, NP   1 tablet at 10/24/15 0809  . nicotine (NICODERM CQ - dosed in mg/24 hours) patch 21 mg  21 mg Transdermal Q0600 Sanjuana Kava, NP   21 mg at 10/24/15 0810  . pantoprazole (PROTONIX) EC tablet 80 mg  80 mg Oral Daily Sanjuana Kava, NP   80 mg at 10/24/15 0809  . QUEtiapine (SEROQUEL) tablet 50 mg  50 mg Oral QHS Craige Cotta, MD   50 mg at 10/23/15 2121  . thiamine (VITAMIN B-1) tablet 100 mg  100 mg Oral Daily Sanjuana Kava, NP   100 mg at 10/24/15 1610    Lab Results:  Results for orders placed or performed during the hospital encounter of 10/18/15 (from the past 48 hour(s))  Valproic acid level     Status: Abnormal    Collection Time: 10/23/15  6:30 AM  Result Value Ref Range   Valproic Acid Lvl 23 (L) 50.0 - 100.0 ug/mL    Comment: Performed at [facility]    Blood Alcohol level:  Lab Results  Component Value Date   [facility] <5 10/18/2015   [facility] 204* 07/01/2015    Physical Findings: AIMS: Facial and Oral Movements  Muscles of Facial Expression: None, normal Lips and Perioral Area: None, normal Jaw: None, normal Tongue: None, normal,Extremity Movements Upper (arms, wrists, hands, fingers): None, normal Lower (legs, knees, ankles, toes): None, normal, Trunk Movements Neck, shoulders, hips: None, normal, Overall Severity Severity of abnormal movements (highest score from questions above): None, normal Incapacitation due to abnormal movements: None, normal Patient's awareness of abnormal movements (rate only patient's report): No Awareness, Dental Status Current problems with teeth and/or dentures?: Yes Does patient usually wear dentures?: Yes  CIWA:  CIWA-Ar Total: 1 COWS:     Musculoskeletal: Strength & Muscle Tone: within normal limits- no tremors, no diaphoresis, no restlessness  Gait & Station: normal, limp to rightside from hip/ back pain Patient leans: N/A  Psychiatric Specialty Exam: Review of Systems  Eyes: Positive for blurred vision.       Patient reports wearing  eyeglasses   Psychiatric/Behavioral: Positive for depression. Negative for suicidal ideas. The patient is nervous/anxious and has insomnia.   All other systems reviewed and are negative.  denies CP, no SOB, no vomiting  Blood pressure 112/80, pulse 102, temperature 98.6 F (37 C), temperature source Oral, resp. rate 102, height 5' 8.2" (1.732 m), weight 79.379 kg (175 lb).Body mass index is 26.46 kg/(m^2).  General Appearance: casual, pleasant and cooperative   Eye Contact::  Fair  Speech:  Normal Rate  Volume:   Normal    Mood:  Reports ongoing depression 2/10.   Affect:   Less constricted, not  irritable or angry today, anxious   Thought Process:  Linear  Orientation:  Full (Time, Place, and Person)  Thought Content:  denies hallucinations, no delusions, not internally preoccupied, ruminative   Suicidal Thoughts:  No- at this time denies any suicidal ideations, and denies any self injurious ideations, contracts for safety on unit   Homicidal Thoughts:  No  Memory:   Recent and remote grossly intact    Judgement:  Good  Insight:  Fair  Psychomotor Activity:  Normal  Concentration:  Good  Recall:  Good  Fund of Knowledge:Good  Language: Good  Akathisia:  No  Handed:  Right  AIMS (if indicated):     Assets:  Communication Skills Desire for Improvement Resilience  ADL's:  Intact  Cognition: WNL  Sleep:  Number of Hours: 6.75     I agree with current treatment plan on 10/23/2015, Patient seen face-to-face for psychiatric evaluation follow-up, chart reviewed. Reviewed the information documented and agree with the treatment plan.  Treatment Plan Summary: Daily contact with patient to assess and evaluate symptoms and progress in treatment, Medication management, Plan inpatient admission and medications as below  Continue to encourage group and milieu participation to work on coping skills and symptom reduction Continue  Depakote ER  750  mgrs QHS for mood disorder, history of angry outbursts  D/C Abilify Continue Seroquel 50 mgrs QHS for mood disorder and insomnia   Continue Hydroxyzine 25 mgrs Q 6 hours PRN for anxiety , as needed  Continue Ativan detox protocol to minimize risk of alcohol WDL   Oneta Rack, NP 10/24/2015, 3:04 PM

## 2015-10-25 MED ORDER — DIVALPROEX SODIUM ER 500 MG PO TB24
1000.0000 mg | ORAL_TABLET | Freq: Every day | ORAL | Status: DC
  Administered 2015-10-26: 1000 mg via ORAL
  Filled 2015-10-25 (×3): qty 2

## 2015-10-25 NOTE — Progress Notes (Signed)
Patient ID: Eric Bentley, male   DOB: [birth year], 40 y.o.   MRN: [id number]   Pt currently presents with a pleasant affect and depressed behavior. Per self inventory, pt rates depression at a 4, hopelessness 3 and anxiety 3. Pt reports poor sleep, a good appetite, normal energy and poor concentration. Pt reports back, hip and knee pain at a 8/10. Pt frequently asks for medications.    Pt provided with medications per providers orders. Pt's labs and vitals were monitored throughout the day. Pt supported emotionally and encouraged to express concerns and questions. Pt educated on medications.  Pt's safety ensured with 15 minute and environmental checks. Pt currently denies SI/HI and A/V hallucinations. Pt verbally agrees to seek staff if SI/HI or A/VH occurs and to consult with staff before acting on these thoughts. Will continue POC.

## 2015-10-25 NOTE — [facility] Group Notes (Signed)
[facility] LCSW Group Therapy 10/25/2015  1:15 pm  Type of Therapy: Group Therapy Participation Level: Limited  Participation Quality: Attentive  Affect: Appropriate  Cognitive: Alert and Oriented  Insight: Developing/Improving and Engaged  Engagement in Therapy: Developing/Improving and Engaged  Modes of Intervention: Clarification, Confrontation, Discussion, Education, Exploration,  Limit-setting, Orientation, Problem-solving, Rapport Building, Dance movement psychotherapist, Socialization and Support  Summary of Progress/Problems: Pt identified obstacles faced currently and processed barriers involved in overcoming these obstacles. Pt identified steps necessary for overcoming these obstacles and explored motivation (internal and external) for facing these difficulties head on. Pt further identified one area of concern in their lives and chose a goal to focus on for today. Patient discussed his tendency to isolate to the point where he starts to talk to himself and gets stuck ruminating about the past. He states that his goal is to "take it one day at a time."  [name], LCSW Clinical Social Worker [facility] [phone]

## 2015-10-25 NOTE — Plan of Care (Signed)
Problem: Diagnosis: Increased Risk For Suicide Attempt Goal: STG-Patient Will Attend All Groups On The Unit Outcome: Progressing Pt attended and engaged in evening wrap up group     

## 2015-10-25 NOTE — Progress Notes (Signed)
Adult Psychoeducational Group Note  Date:  10/25/2015 Time:  8:15 PM  Group Topic/Focus:  Wrap-Up Group:   The focus of this group is to help patients review their daily goal of treatment and discuss progress on daily workbooks.  Participation Level:  Active  Participation Quality:  Appropriate  Affect:  Appropriate  Cognitive:  Appropriate  Insight: Appropriate  Engagement in Group:  Engaged  Modes of Intervention:  Discussion  Additional Comments:  Pt was pleasant during wrap-up group. Pt rated his overall day a 6 out of 10 ans stated that his day was "decent". Pt reported that he did not have a goal for the day, however he was happy to learn that he is getting discharged tomorrow.   Cleotilde Neer 10/25/2015, 8:52 PM

## 2015-10-25 NOTE — [facility] Group Notes (Signed)
   [facility] LCSW Aftercare Discharge Planning Group Note  10/25/2015  8:45 AM   Participation Quality: Alert, Appropriate and Oriented  Mood/Affect: Appropriate  Depression Rating: 4-5  Anxiety Rating: 2  Thoughts of Suicide: Pt denies SI/HI  Will you contract for safety? Yes  Current AVH: Pt denies  Plan for Discharge/Comments: Pt attended discharge planning group and actively participated in group. CSW provided pt with today's workbook. Patient reports feeling good today and feels that his medications are working well for him. He will return home to follow up with outpatient services.   Transportation Means: Pt reports access to transportation  Supports: No supports mentioned at this time  [name], MSW, [employer] Clinical Social Worker [employer] [phone]

## 2015-10-25 NOTE — Progress Notes (Signed)
D:Patient in his room in bed on approach.  Patient states he had a good day today.  Patient states his goal was to make it through the day with anxiety and without depression.  Patient states he met his goal.  Patient pleasant and cooperative.  Patient states he has been eating well since he has been here. Patient denies SI/HI and denies AVH. A: Staff to monitor Q 15 mins for safety.  Encouragement and support offered.  Scheduled medications administered per orders.  Tylenol administered prn for pain. R: Patient remains safe on the unit.  Patient attended group tonight.  Patient visible on the unit and interacting with peers.  Patient taking administered medications.

## 2015-10-25 NOTE — Progress Notes (Signed)
Patient ID: Eric Bentley, male   DOB: [birth year], 40 y.o.   MRN: [id number] D: Patient alert and cooperative. Pt reports his day was boring did not get much accomplished. Pt observed interacting well with peers. Pt denies SI/HI/AVH. Pt reports tolerating medication well. Pt attended and participated in evening wrap up group.  A: Met with pt 1:1. Medications administered as prescribed. Support and encouragement provided. Pt encouraged to discuss feelings and come to staff with any question or concerns.  R: Patient remains safe and complaint with medications.

## 2015-10-25 NOTE — Progress Notes (Signed)
Patient ID: Eric Bentley, male   DOB: 09/05/1975, 40 y.o.   MRN: [id number] [facility] MD Progress Note  10/25/2015 4:01 PM Rishith Siddoway  MRN:  [id number] Subjective:    Patient reports overall improvement compared to his admission, and reports feeling less depressed, less irritable.  He is currently tolerating medications well , and feels medications are helping . Denies side effects. At present endorses some cravings for alcohol, but states he is highly motivated in avoiding alcohol and drugs after discharge.  States he is planning on returning to work soon after discharge   Objective :  I have discussed case with treatment team and have met with patient. Patient presents with improved mood and improved range of affect compared to admission.  He is more visible on unit and has been going to more groups, noted to be interacting more with peers . Behavior on unit in good control. No disruptive or agitated behaviors. Thus far tolerating Depakote ER trial well. Recent serum level was sub-therapeutic     Principal Problem: Bipolar 1 disorder, mixed, moderate (HCC) Diagnosis:   Patient Active Problem List   Diagnosis Date Noted  . Alcohol use disorder, moderate, dependence ([place]) [F10.20] 10/18/2015  . Atypical chest pain [R07.89] 09/30/2015  . Cocaine abuse [F14.10] 07/01/2015  . Prediabetes [R73.03] 12/08/2013  . Chronic anxiety [F41.9] 12/08/2013  . Vitamin D deficiency [E55.9] 11/16/2013  . Medication management [Z79.899] 11/16/2013  . Essential hypertension [I10] 10/14/2013  . Hyperlipidemia [E78.2] 10/14/2013  . Bipolar 1 disorder, mixed, moderate ([place]) [F31.62] 10/14/2013   Total Time spent with patient: 20 minutes   Past Medical History:  Past Medical History  Diagnosis Date  . Hyperlipidemia   . Hypertension   . Depression   . Anxiety   . Unspecified essential hypertension 10/14/2013  . Mixed hyperlipidemia 10/14/2013  . Bipolar 1 disorder, mixed, moderate ([place]) 10/14/2013  .  Atypical chest pain    History reviewed. No pertinent past surgical history. Family History:  Family History  Problem Relation Age of Onset  . Hyperlipidemia Mother   . Hypertension Mother   . Heart disease Mother 45    fatal  . Early death Mother   . Depression Mother   . Cancer Sister     breast  . Depression Sister   . Heart disease Brother   . Hyperlipidemia Brother   . Hypertension Brother   . Stroke Brother 28    OBESE 400#  . Heart disease Paternal Grandfather   . Hyperlipidemia Paternal Grandfather   . Hypertension Paternal Grandfather     Social History:  History  Alcohol Use  . 0.0 oz/week  . 0 [place] drinks or equivalent per week    Comment: occasional     History  Drug Use  . Yes  . Special: Cocaine, Marijuana    Comment: crack    Social History   Social History  . Marital Status: Single    Spouse Name: N/A  . Number of Children: N/A  . Years of Education: N/A   Social History Main Topics  . Smoking status: Current Every Day Smoker -- 0.50 packs/day for 15 years  . Smokeless tobacco: None  . Alcohol Use: 0.0 oz/week    0 [place] drinks or equivalent per week     Comment: occasional  . Drug Use: Yes    Special: Cocaine, Marijuana     Comment: crack  . Sexual Activity: Not Asked   Other Topics Concern  . None   Social  History Narrative   Additional Social History:    Pain Medications: pt denies abuse - see PTA meds list Prescriptions: pt denies abuse - see PTA meds list Over the Counter: pt denies abuse - see PTA meds list History of alcohol / drug use?: Yes Negative Consequences of Use: Financial, Work / Youth worker, Personal relationships Name of Substance 1: alcohol 1 - Age of First Use: teenager 1 - Amount (size/oz): one 40 oz beer 1 - Frequency: every other day 1 - Duration: months 1 - Last Use / Amount: 10/15/15 - one 40 oz beer Name of Substance 2: marijuana 2 - Age of First Use: unknown 2 - Amount (size/oz): half a bud 2 -  Frequency: twice weekly 2 - Duration: months Name of Substance 3: cocaine - crack and powder 3 - Frequency: only when friends have it in their possession 3 - Last Use / Amount: 10/14/15 - one line  Sleep:  Improved   Appetite:   Improved    Current Medications: Current Facility-Administered Medications  Medication Dose Route Frequency Provider Last Rate Last Dose  . acetaminophen (TYLENOL) tablet 650 mg  650 mg Oral Q6H PRN Encarnacion Slates, NP   650 mg at 10/25/15 1008  . alum & mag hydroxide-simeth (MAALOX/MYLANTA) 200-200-20 MG/5ML suspension 30 mL  30 mL Oral Q4H PRN Encarnacion Slates, NP      . Derrill Memo ON 10/26/2015] divalproex (DEPAKOTE ER) 24 hr tablet 1,000 mg  1,000 mg Oral Daily Myer Peer Rinoa Garramone, MD      . fenofibrate tablet 54 mg  54 mg Oral Daily Encarnacion Slates, NP   54 mg at 10/25/15 0806  . hydrOXYzine (ATARAX/VISTARIL) tablet 25 mg  25 mg Oral Q6H PRN Jenne Campus, MD   25 mg at 10/25/15 1433  . lisinopril (PRINIVIL,ZESTRIL) tablet 20 mg  20 mg Oral Daily Encarnacion Slates, NP   20 mg at 10/25/15 0806  . magnesium hydroxide (MILK OF MAGNESIA) suspension 30 mL  30 mL Oral Daily PRN Encarnacion Slates, NP   30 mL at 10/20/15 0609  . meloxicam (MOBIC) tablet 15 mg  15 mg Oral Daily Encarnacion Slates, NP   15 mg at 10/25/15 1610  . multivitamin with minerals tablet 1 tablet  1 tablet Oral Daily Encarnacion Slates, NP   1 tablet at 10/25/15 0806  . nicotine (NICODERM CQ - dosed in mg/24 hours) patch 21 mg  21 mg Transdermal Q0600 Encarnacion Slates, NP   21 mg at 10/25/15 9604  . pantoprazole (PROTONIX) EC tablet 80 mg  80 mg Oral Daily Encarnacion Slates, NP   80 mg at 10/24/15 0809  . QUEtiapine (SEROQUEL) tablet 50 mg  50 mg Oral QHS Jenne Campus, MD   50 mg at 10/24/15 2111  . thiamine (VITAMIN B-1) tablet 100 mg  100 mg Oral Daily Encarnacion Slates, NP   100 mg at 10/25/15 5409    Lab Results:  No results found for this or any previous visit (from the past 48 hour(s)).  Blood Alcohol level:  Lab Results   Component Value Date   [facility] <5 10/18/2015   ETH 204* 07/01/2015    Physical Findings: AIMS: Facial and Oral Movements Muscles of Facial Expression: None, normal Lips and Perioral Area: None, normal Jaw: None, normal Tongue: None, normal,Extremity Movements Upper (arms, wrists, hands, fingers): None, normal Lower (legs, knees, ankles, toes): None, normal, Trunk Movements Neck, shoulders, hips: None, normal, Overall Severity Severity  of abnormal movements (highest score from questions above): None, normal Incapacitation due to abnormal movements: None, normal Patient's awareness of abnormal movements (rate only patient's report): No Awareness, Dental Status Current problems with teeth and/or dentures?: Yes Does patient usually wear dentures?: Yes  CIWA:  CIWA-Ar Total: 1 COWS:     Musculoskeletal: Strength & Muscle Tone: within normal limits- no tremors, no diaphoresis, no restlessness  Gait & Station: normal Patient leans: N/A  Psychiatric Specialty Exam: Review of Systems  Psychiatric/Behavioral: Negative for depression and suicidal ideas. The patient is nervous/anxious and has insomnia.   All other systems reviewed and are negative.  denies CP, no SOB, no vomiting  Blood pressure 113/76, pulse 96, temperature 97.2 F (36.2 C), temperature source Oral, resp. rate 16, height 5' 8.2" (1.732 m), weight 175 lb (79.379 kg).Body mass index is 26.46 kg/(m^2).  General Appearance: improved grooming   Eye Contact::  Improved   Speech:  Normal Rate  Volume:   Normal    Mood:  Partially improved mood, less irritable   Affect:   Less constricted, reactive, at this time not irritable   Thought Process:  Linear  Orientation:  Full (Time, Place, and Person)  Thought Content:  denies hallucinations, no delusions, not internally preoccupied, ruminative   Suicidal Thoughts:  No- at this time denies any suicidal ideations, and denies any self injurious ideations, contracts for safety on unit    Homicidal Thoughts:  No specifically denies any thoughts of hurting his boss/employer or any co-worker, states " I love the guy, really, he is OK"   Memory:   Recent and remote grossly intact    Judgement:  Good  Insight:   Improving   Psychomotor Activity:  Normal  Concentration:  Good  Recall:  Good  Fund of Knowledge:Good  Language: Good  Akathisia:  No  Handed:  Right  AIMS (if indicated):     Assets:  Communication Skills Desire for Improvement Resilience  ADL's:  Intact  Cognition: WNL  Sleep:  Number of Hours: 6.75   Assessment - patient's mood and affect are improved compared to admission presentation. At this time mood improved, and no irritability or significant dysphoria. Tolerating Seroquel well, and states he is sleeping better and feeling calmer on this medication. Tolerating Depakote ER trial well, feels it is helping him remain " calmer". Reports cravings to drink, but motivation to maintain sobriety. We discussed options such as Campral- not currently interested . Focused on returning to work soon.  Treatment Plan Summary: Daily contact with patient to assess and evaluate symptoms and progress in treatment, Medication management, Plan inpatient admission and medications as below  Continue to encourage group and milieu participation to work on coping skills and symptom reduction Increase  Depakote ER  To 1000   mgrs QHS for mood disorder, history of angry outbursts  Continue Seroquel 50 mgrs QHS for mood disorder and insomnia  Continue Hydroxyzine 25 mgrs Q 6 hours PRN for anxiety , as needed  Treatment team working on disposition planning   Neita Garnet, MD 10/25/2015, 4:01 PM

## 2015-10-26 ENCOUNTER — Ambulatory Visit (HOSPITAL_COMMUNITY): Payer: Self-pay | Admitting: Psychiatry

## 2015-10-26 MED ORDER — DIVALPROEX SODIUM ER 500 MG PO TB24: 1000.0000 mg | ORAL_TABLET | Freq: Every day | ORAL | Status: DC

## 2015-10-26 MED ORDER — NICOTINE 21 MG/24HR TD PT24: 21.0000 mg | MEDICATED_PATCH | Freq: Every day | TRANSDERMAL | Status: AC

## 2015-10-26 MED ORDER — MELOXICAM 15 MG PO TABS: 15.0000 mg | ORAL_TABLET | Freq: Every day | ORAL | Status: DC

## 2015-10-26 MED ORDER — LISINOPRIL 20 MG PO TABS: 20.0000 mg | ORAL_TABLET | Freq: Every day | ORAL | Status: AC

## 2015-10-26 MED ORDER — QUETIAPINE FUMARATE 50 MG PO TABS: 50.0000 mg | ORAL_TABLET | Freq: Every day | ORAL | Status: AC

## 2015-10-26 MED ORDER — DIVALPROEX SODIUM ER 500 MG PO TB24: 1000.0000 mg | ORAL_TABLET | Freq: Every day | ORAL | Status: AC

## 2015-10-26 MED ORDER — LISINOPRIL 20 MG PO TABS: 20.0000 mg | ORAL_TABLET | Freq: Every day | ORAL | Status: DC

## 2015-10-26 MED ORDER — HYDROXYZINE HCL 25 MG PO TABS: 25.0000 mg | ORAL_TABLET | Freq: Four times a day (QID) | ORAL | Status: AC | PRN

## 2015-10-26 MED ORDER — NICOTINE 21 MG/24HR TD PT24: 21.0000 mg | MEDICATED_PATCH | Freq: Every day | TRANSDERMAL | Status: DC

## 2015-10-26 MED ORDER — OMEPRAZOLE 40 MG PO CPDR: 40.0000 mg | DELAYED_RELEASE_CAPSULE | Freq: Every day | ORAL | Status: AC

## 2015-10-26 MED ORDER — MELOXICAM 15 MG PO TABS: 15.0000 mg | ORAL_TABLET | Freq: Every day | ORAL | Status: AC

## 2015-10-26 MED ORDER — PRAVASTATIN SODIUM 40 MG PO TABS
40.0000 mg | ORAL_TABLET | Freq: Every day | ORAL | Status: AC
Start: 2015-10-26 — End: ?

## 2015-10-26 MED ORDER — FENOFIBRATE 145 MG PO TABS: 145.0000 mg | ORAL_TABLET | Freq: Every day | ORAL | Status: AC

## 2015-10-26 MED ORDER — HYDROXYZINE HCL 25 MG PO TABS: 25.0000 mg | ORAL_TABLET | Freq: Four times a day (QID) | ORAL | Status: DC | PRN
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
## 2015-10-26 NOTE — [facility] Group Notes (Signed)

## 2015-10-26 NOTE — Progress Notes (Signed)
  [facility] Adult Case Management Discharge Plan :  Will you be returning to the same living situation after discharge:  Yes,  patient plans to return home At discharge, do you have transportation home?: Yes,  patient reports access to transportation Do you have the ability to pay for your medications: Yes,  patient will be provided with prescriptions at discharge  Release of information consent forms completed and in the chart;  Patient's signature needed at discharge.  Patient to Follow up at: Follow-up Information    Follow up with [facility].   Specialty:  Behavioral Health   Why:  Medication management appointment on 2/28 at 4:30pm with Dr. Lolly Mustache. Therapy appointment on 3/2 at 11:00am with Ailene Ards information:   [address] [place] [address] [phone]      Next level of care provider has access to [facility] Link: yes  Safety Planning and Suicide Prevention discussed: Yes,  with patient  Have you used any form of tobacco in the last 30 days? (Cigarettes, Smokeless Tobacco, Cigars, and/or Pipes): Yes  Has patient been referred to the Quitline?: Patient refused referral  Patient has been referred for addiction treatment: N/A  Jinger Middlesworth, [place] 10/26/2015, 9:48 AM

## 2015-10-26 NOTE — Tx Team (Signed)
Interdisciplinary Treatment Plan Update (Adult) Date: 10/26/2015   Date: 10/26/2015 9:30am  Progress in Treatment:  Attending groups: Yes Participating in groups: Minimally Taking medication as prescribed: Yes  Tolerating medication: Yes  Family/Significant othe contact made: No, Pt declines Patient understands diagnosis: Yes AEB seeking help with depression Discussing patient identified problems/goals with staff: Yes  Medical problems stabilized or resolved: Yes  Denies suicidal/homicidal ideation: Yes Patient has not harmed self or Others: Yes   New problem(s) identified: None identified at this time.   Discharge Plan or Barriers: Pt will return home and follow-up with [facility].  Additional comments:  Patient and CSW reviewed pt's identified goals and treatment plan. Patient verbalized understanding and agreed to treatment plan. CSW reviewed [facility] "Discharge Process and Patient Involvement" Form. Pt verbalized understanding of information provided and signed form.   Reason for Continuation of Hospitalization:  Anxiety Depression Medication stabilization Suicidal ideation Withdrawal symptoms   Estimated length of stay: Discharge anticipated for today 10/26/15  Review of initial/current patient goals per problem list:   1.  Goal(s): Patient will participate in aftercare plan  Met:  Yes  Target date: 3-5 days from date of admission   As evidenced by: Patient will participate within aftercare plan AEB aftercare provider and housing plan at discharge being identified.   10/19/15: Pt discharging home and will follow-up with [facility].  2.  Goal (s): Patient will exhibit decreased depressive symptoms and suicidal ideations.  Met:  Adequate for discharge per MD  Target date: 3-5 days from date of admission   As evidenced by: Patient will utilize self rating of depression at 3 or below and demonstrate decreased signs of depression or be deemed stable for  discharge by MD. 10/19/15: Pt was admitted with symptoms of depression, rating 10/10. Pt continues to present with flat affect and depressive symptoms.  Pt will demonstrate decreased symptoms of depression and rate depression at 3/10 or lower prior to discharge. 10/22/15: Pt is reporting improvement in depression levels; denies SI 2/27: Adequate for discharge. Patient rates depression at 4/5 denies SI.  3.  Goal(s): Patient will demonstrate decreased signs and symptoms of anxiety.  Met:  Yes  Target date: 3-5 days from date of admission   As evidenced by: Patient will utilize self rating of anxiety at 3 or below and demonstrated decreased signs of anxiety, or be deemed stable for discharge by MD 10/19/15: Pt was admitted with increased levels of anxiety and is currently rating those symptoms highly. Pt will demonstrated decreased symptoms of anxiety and rate it at 3/10 prior to d/c. 10/22/15: Pt continues to report high levels of anxiety. 2/27: Goal met. Patient rates anxiety at 2 today.  4. Goal(s): Patient will demonstrate decreased signs and symptoms of withdrawal related to substance abuse.  Met: Yes  Target Date: 3-5 days from date of admission  As evidenced by: CIWA or COWS score of 0 or be deemed stable for discharge by MD.  10/22/15: Pt CIWA score of 6 and endorses tremor, disorientation, and anxiety as symptoms of withdrawal.   2/28: Goal met. No withdrawal symptoms reported at this time per medical chart.   6. Goal (s):  Patient will demonstrate decreased signs of mania . Met:  Yes . Target date: 3-5 days from date of admission  . As evidenced by:  Patient demonstrate decreased signs of mania AEB decreased mood instability and demonstration of stable mood    -10/19/15: Pt is irritable and easily agitated, however can  be redirected.    -10/22/15: Pt less irritable and agitated. Mood more stable    2/28: Goal met. Patient's symptoms at baseline.  Attendees:  Patient:     Family:    Physician: Dr. Parke Poisson, MD  10/26/2015 9:30 AM  Nursing: Lars Pinks, RN Case manager  10/26/2015 9:30 AM  Clinical Social Worker:  10/26/2015 9:30 AM  Other: Erasmo Downer Nikolos Billig, LCSWA 10/26/2015 9:30 AM  Clinical: Mayra Neer, Grayland Ormond, RN 10/26/2015 9:30 AM  Other: Larose Kells, NP 10/26/2015 9:30 AM  Other:          Tilden Fossa, LCSW Clinical Social Worker [facility] [phone]

## 2015-10-26 NOTE — Progress Notes (Signed)
D:  Patient's self inventory sheet, patient has good sleep, no sleep medication given.  Good appetite, normal energy level, poor concentration.  Rated depression 6, hopeless and anxiety 5.  Withdrawals, cravings, irritability.  Physical problems, pain, back, hip, knee.  Pain medication is not helpful. A:  Medications administered per MD orders.  Emotional support and encouragement given patient. R:  Denied SI and HI, contracts for safety.  Denied A/V hallucinations.  Safety maintained with 15 minute checks.

## 2015-10-26 NOTE — [facility] Suicide Risk Assessment (Addendum)
[facility] Discharge Suicide Risk Assessment   Principal Problem: Bipolar 1 disorder, mixed, moderate (HCC) Discharge Diagnoses:  Patient Active Problem List   Diagnosis Date Noted  . Alcohol use disorder, moderate, dependence (HCC) [F10.20] 10/18/2015  . Atypical chest pain [R07.89] 09/30/2015  . Cocaine abuse [F14.10] 07/01/2015  . Prediabetes [R73.03] 12/08/2013  . Chronic anxiety [F41.9] 12/08/2013  . Vitamin D deficiency [E55.9] 11/16/2013  . Medication management [Z79.899] 11/16/2013  . Essential hypertension [I10] 10/14/2013  . Hyperlipidemia [E78.2] 10/14/2013  . Bipolar 1 disorder, mixed, moderate (HCC) [F31.62] 10/14/2013    Total Time spent with patient: 30 minutes  Musculoskeletal: Strength & Muscle Tone: within normal limits Gait & Station: normal Patient leans: N/A  Psychiatric Specialty Exam: ROS  Blood pressure 117/80, pulse 92, temperature 97.6 F (36.4 C), temperature source Oral, resp. rate 16, height 5' 8.2" (1.732 m), weight 175 lb (79.379 kg).Body mass index is 26.46 kg/(m^2).  General Appearance: improved grooming   Eye Contact::  Good  Speech:  Normal Rate409  Volume:  Normal  Mood:  improved and at this time presents euthymic   Affect:  Appropriate  Thought Process:  Linear  Orientation:  Full (Time, Place, and Person)  Thought Content:  denies hallucinations, no delusions, not internally preoccupied   Suicidal Thoughts:  No denies any suicidal ideations, denies any self injurious ideations, denies homicidal ideations , denies any violent ideations   Homicidal Thoughts:  No  Memory:  recent and remote grossly intact   Judgement:  Other:  improved   Insight:  Present  Psychomotor Activity:  Normal  Concentration:  Good  Recall:  Good  Fund of Knowledge:Good  Language: Good  Akathisia:  Negative  Handed:  Right  AIMS (if indicated):     Assets:  Communication Skills Desire for Improvement Resilience  Sleep:  Number of Hours: 6.5  Cognition: WNL   ADL's:  Intact   Mental Status Per Nursing Assessment::   On Admission:     Demographic Factors:  40 year old male , single, no children, employed   Loss Factors: Employment stressors   Historical Factors: History of mood disorder, history of explosiveness, history of alcohol abuse, history of cocaine abuse   Risk Reduction Factors:   Employed and Positive coping skills or problem solving skills  Continued Clinical Symptoms:  At this time patient much improved compared to admission, mood is improve and currently is euthymic, affect is fully reactive, no thought disorder, no SI or HI, no violent ideations, no hallucinations, no delusions, future oriented , 0x3 .  No residual symptoms of WDL . Denies medication side effects .  Cognitive Features That Contribute To Risk:  No gross cognitive deficits noted upon discharge. Is alert , attentive, and oriented x 3   Suicide Risk:  Mild:  Suicidal ideation of limited frequency, intensity, duration, and specificity.  There are no identifiable plans, no associated intent, mild dysphoria and related symptoms, good self-control (both objective and subjective assessment), few other risk factors, and identifiable protective factors, including available and accessible social support.  Follow-up Information    Follow up with [facility].   Specialty:  Behavioral Health   Why:  Medication management appointment on 2/28 at 4:30pm with Dr. Lolly Mustache. Therapy appointment on 3/2 at 11:00am with Ailene Ards information:   [address] [place] [address] [phone]      Plan Of Care/Follow-up recommendations:  Activity:  as tolerated Diet:  Regular Tests:  NA  Other:  See below  Patient is leaving unit in good spirits  Patient plans to return home. Plans to return to work soon Follow up as above  Encouraged patient to maintain focus on sobriety and relapse prevention , consider  going to 12 step meetings  Has a PCP, Dr. Karl Ito for medical issues .  Nehemiah Massed, MD 10/26/2015, 9:48 AM

## 2015-10-26 NOTE — Progress Notes (Signed)
Discharge Note:  Patient discharged home.  Patient denied SI and HI.  Denied A/V hallucinations.  Patient received all his belongings, clothing, misc items, toiletries, prescriptions, coat, boots, belt, keys, wallet.  Suicide prevention information given and discussed with patient who stated he understood and had no questions.  Patient stated he appreciated all assistance received by [facility] staff.  All discharge papers given to patient as required by [facility].

## 2015-10-28 ENCOUNTER — Encounter (HOSPITAL_COMMUNITY): Payer: Self-pay | Admitting: Psychology

## 2015-10-28 ENCOUNTER — Ambulatory Visit (HOSPITAL_COMMUNITY): Payer: Self-pay | Admitting: Psychology

## 2015-10-28 NOTE — Progress Notes (Signed)
Eric Bentley is a 40 y.o. male patient who didn't show for his appointment.  Letter sent.        Forde Radon, LPC

## 2015-12-31 ENCOUNTER — Ambulatory Visit: Payer: Self-pay | Admitting: Internal Medicine

## 2016-01-04 ENCOUNTER — Encounter (HOSPITAL_COMMUNITY): Payer: Self-pay | Admitting: Psychology

## 2016-03-23 ENCOUNTER — Encounter (HOSPITAL_COMMUNITY): Payer: Self-pay | Admitting: Psychology

## 2016-03-23 DIAGNOSIS — F3162 Bipolar disorder, current episode mixed, moderate: Secondary | ICD-10-CM

## 2016-03-23 NOTE — Progress Notes (Signed)
Eric Bentley is a 40 y.o. male patient who is discharged from counseling as last seen 10/18/15.  Outpatient Therapist Discharge Summary  [name]    March 14, 1976   Admission Date: 09/15/15    Discharge Date:  03/23/16 Reason for Discharge:  Not active Diagnosis:    Bipolar 1 disorder, mixed, moderate (HCC)   Comments:  Pt didn't show for f/u appointments.  Didn't respond to attempt to contact for further tx.   Alfredo Batty, LPC

## 2020-03-21 ENCOUNTER — Emergency Department (HOSPITAL_COMMUNITY)
Admission: EM | Admit: 2020-03-21 | Discharge: 2020-03-21 | Disposition: A | Discharge status: Home / Self Care | Payer: Self-pay | Attending: Emergency Medicine | Admitting: Emergency Medicine

## 2020-03-21 ENCOUNTER — Other Ambulatory Visit: Payer: Self-pay

## 2020-03-21 ENCOUNTER — Encounter (HOSPITAL_COMMUNITY): Payer: Self-pay | Admitting: Emergency Medicine

## 2020-03-21 ENCOUNTER — Emergency Department (HOSPITAL_COMMUNITY): Payer: BLUE CROSS/BLUE SHIELD

## 2020-03-21 DIAGNOSIS — Z79899 Other long term (current) drug therapy: Secondary | ICD-10-CM | POA: Insufficient documentation

## 2020-03-21 DIAGNOSIS — I10 Essential (primary) hypertension: Secondary | ICD-10-CM | POA: Insufficient documentation

## 2020-03-21 DIAGNOSIS — K4090 Unilateral inguinal hernia, without obstruction or gangrene, not specified as recurrent: Secondary | ICD-10-CM | POA: Insufficient documentation

## 2020-03-21 DIAGNOSIS — F17210 Nicotine dependence, cigarettes, uncomplicated: Secondary | ICD-10-CM | POA: Insufficient documentation

## 2020-03-21 LAB — CBC
HCT: 49.3 % (ref 39.0–52.0)
Hemoglobin: 17 g/dL (ref 13.0–17.0)
MCH: 31.5 pg (ref 26.0–34.0)
MCHC: 34.5 g/dL (ref 30.0–36.0)
MCV: 91.3 fL (ref 80.0–100.0)
Platelets: 331 10*3/uL (ref 150–400)
RBC: 5.4 MIL/uL (ref 4.22–5.81)
RDW: 11.8 % (ref 11.5–15.5)
WBC: 12.5 10*3/uL — ABNORMAL HIGH (ref 4.0–10.5)
nRBC: 0 % (ref 0.0–0.2)

## 2020-03-21 LAB — LIPASE, BLOOD: Lipase: 41 U/L (ref 11–51)

## 2020-03-21 LAB — COMPREHENSIVE METABOLIC PANEL
ALT: 23 U/L (ref 0–44)
AST: 20 U/L (ref 15–41)
Albumin: 4.3 g/dL (ref 3.5–5.0)
Alkaline Phosphatase: 71 U/L (ref 38–126)
Anion gap: 11 (ref 5–15)
BUN: 21 mg/dL — ABNORMAL HIGH (ref 6–20)
CO2: 24 mmol/L (ref 22–32)
Calcium: 9.5 mg/dL (ref 8.9–10.3)
Chloride: 103 mmol/L (ref 98–111)
Creatinine, Ser: 1.26 mg/dL — ABNORMAL HIGH (ref 0.61–1.24)
GFR calc Af Amer: >60 mL/min (ref >60)
GFR calc non Af Amer: >60 mL/min (ref >60)
Glucose, Bld: 124 mg/dL — ABNORMAL HIGH (ref 70–99)
Potassium: 3.9 mmol/L (ref 3.5–5.1)
Sodium: 138 mmol/L (ref 135–145)
Total Bilirubin: 0.6 mg/dL (ref 0.3–1.2)
Total Protein: 6.6 g/dL (ref 6.5–8.1)

## 2020-03-21 MED ORDER — SODIUM CHLORIDE 0.9% FLUSH: 3.0000 mL | Freq: Once | INTRAVENOUS | Status: DC

## 2020-03-21 MED ORDER — IOHEXOL 300 MG/ML  SOLN
100.0000 mL | Freq: Once | INTRAMUSCULAR | Status: AC | PRN
  Administered 2020-03-21: 100 mL via INTRAVENOUS

## 2020-03-21 NOTE — ED Notes (Signed)
Discharge instructions discussed with pt. Pt verbalized understanding with no questions at this time. Pt ambulatory at discharge 

## 2020-03-21 NOTE — Discharge Instructions (Addendum)
Please return the emergency department if your symptoms once again worsen.  I have attached information regarding inguinal hernias.  Below is contact information for Dr. Doylene Canard who is a Careers adviser in our area.  Feel free to reach out to her for follow-up.

## 2020-03-21 NOTE — ED Provider Notes (Signed)
[facility] EMERGENCY DEPARTMENT Provider Note   CSN: [id number] Arrival date & time: 03/21/20  1637     History Chief Complaint  Patient presents with  . Abdominal Pain    Eric Bentley is a 44 y.o. male.  HPI   Patient is a 44 year old male with a medical history as noted below.  Patient states about 5 to 6 weeks ago he was moving heavy materials for his work and felt a tearing sensation in the left groin.  He had moderate pain and a "bulge" that radiated to his testicles.  He was evaluated in urgent care and told that he had hernia but at the time the hernia was completely reduced.  He states that since he was initially evaluated he had one episode of herniation in the left groin but not in the scrotal region.  He reduce this manually and has not had an occurrence since.  Earlier today he got home from work and experienced an episode of nausea as well as vomiting.  Nonbilious nonbloody.  He then began experiencing a 10/10 "twisting" pain in his central abdomen.  While waiting in the lobby of the emergency department this pain subsided.  Patient denies any pain or nausea at this time.  He reports feeling very anxious because he was told at urgent care that if his hernia becomes incarcerated he could become septic.  No fevers, chills, nausea, vomiting, chest pain, shortness of breath, syncope.     Past Medical History:  Diagnosis Date  . Anxiety   . Atypical chest pain   . Bipolar 1 disorder, mixed, moderate (HCC) 10/14/2013  . Depression   . Hyperlipidemia   . Hypertension   . Mixed hyperlipidemia 10/14/2013  . Unspecified essential hypertension 10/14/2013    Patient Active Problem List   Diagnosis Date Noted  . Alcohol use disorder, moderate, dependence (HCC) 10/18/2015  . Atypical chest pain 09/30/2015  . Cocaine abuse (HCC) 07/01/2015  . Prediabetes 12/08/2013  . Chronic anxiety 12/08/2013  . Vitamin D deficiency 11/16/2013  . Medication management  11/16/2013  . Essential hypertension 10/14/2013  . Hyperlipidemia 10/14/2013  . Bipolar 1 disorder, mixed, moderate (HCC) 10/14/2013    History reviewed. No pertinent surgical history.     Family History  Problem Relation Age of Onset  . Hyperlipidemia Mother   . Hypertension Mother   . Heart disease Mother 62       fatal  . Early death Mother   . Depression Mother   . Cancer Sister        breast  . Depression Sister   . Heart disease Brother   . Hyperlipidemia Brother   . Hypertension Brother   . Stroke Brother 28       OBESE 400#  . Heart disease Paternal Grandfather   . Hyperlipidemia Paternal Grandfather   . Hypertension Paternal Grandfather     Social History   Tobacco Use  . Smoking status: Current Every Day Smoker    Packs/day: 0.50    Years: 15.00    Pack years: 7.50  . Smokeless tobacco: Never Used  Substance Use Topics  . Alcohol use: Yes    Alcohol/week: 0.0 [place] drinks    Comment: occasional  . Drug use: Yes    Types: Cocaine, Marijuana    Comment: crack    Home Medications Prior to Admission medications   Medication Sig Start Date End Date Taking? Authorizing Provider  divalproex (DEPAKOTE ER) 500 MG 24 hr tablet Take  2 tablets (1,000 mg total) by mouth daily. For mood stabilization 10/26/15   Armandina Stammer I, NP  fenofibrate (TRICOR) 145 MG tablet Take 1 tablet (145 mg total) by mouth daily. For high cholesterol 10/26/15   Armandina Stammer I, NP  hydrOXYzine (ATARAX/VISTARIL) 25 MG tablet Take 1 tablet (25 mg total) by mouth every 6 (six) hours as needed for anxiety. 10/26/15   Armandina Stammer I, NP  lisinopril (PRINIVIL,ZESTRIL) 20 MG tablet Take 1 tablet (20 mg total) by mouth daily. For high blood pressure 10/26/15   Nwoko, Nicole Kindred I, NP  meloxicam (MOBIC) 15 MG tablet Take 1 tablet (15 mg total) by mouth daily. For arthritic pain 10/26/15   Armandina Stammer I, NP  nicotine (NICODERM CQ - DOSED IN MG/24 HOURS) 21 mg/24hr patch Place 1 patch (21 mg total) onto  the skin daily at 6 (six) AM. For smoking cessation 10/26/15   Armandina Stammer I, NP  omeprazole (PRILOSEC) 40 MG capsule Take 1 capsule (40 mg total) by mouth daily. For acid reflux 10/26/15   Armandina Stammer I, NP  pravastatin (PRAVACHOL) 40 MG tablet Take 1 tablet (40 mg total) by mouth daily. For cholesterol 10/26/15   Nwoko, Nicole Kindred I, NP  QUEtiapine (SEROQUEL) 50 MG tablet Take 1 tablet (50 mg total) by mouth at bedtime. For mood control 10/26/15   Armandina Stammer I, NP    Allergies    Patient has no known allergies.  Review of Systems   Review of Systems  All other systems reviewed and are negative. Ten systems reviewed and are negative for acute change, except as noted in the HPI.   Physical Exam Updated Vital Signs BP (!) 144/113   Pulse 100   Temp 98.1 F (36.7 C) (Oral)   Resp 18   SpO2 99%   Physical Exam Vitals and nursing note reviewed.  Constitutional:      General: He is not in acute distress.    Appearance: Normal appearance. He is well-developed. He is not ill-appearing, toxic-appearing or diaphoretic.  HENT:     Head: Normocephalic and atraumatic.     Right Ear: External ear normal.     Left Ear: External ear normal.     Nose: Nose normal.     Mouth/Throat:     Mouth: Mucous membranes are moist.     Pharynx: Oropharynx is clear. No oropharyngeal exudate or posterior oropharyngeal erythema.  Eyes:     Extraocular Movements: Extraocular movements intact.  Cardiovascular:     Rate and Rhythm: Normal rate and regular rhythm.     Pulses: Normal pulses.     Heart sounds: Normal heart sounds. No murmur heard.  No friction rub. No gallop.   Pulmonary:     Effort: Pulmonary effort is normal. No respiratory distress.     Breath sounds: Normal breath sounds. No stridor. No wheezing, rhonchi or rales.  Abdominal:     General: Abdomen is flat. Bowel sounds are normal.     Palpations: Abdomen is soft.     Tenderness: There is no abdominal tenderness.     Comments: Protuberant  abdomen that is soft and nontender in all quadrants.  No palpable hernia noted.  Genitourinary:    Comments: Normal-appearing penis, scrotum, testicles.  Both testicles are nontender.  No edema or erythema noted. Musculoskeletal:        General: Normal range of motion.     Cervical back: Normal range of motion and neck supple. No tenderness.  Skin:  General: Skin is warm and dry.  Neurological:     General: No focal deficit present.     Mental Status: He is alert and oriented to person, place, and time.  Psychiatric:        Mood and Affect: Mood normal.        Behavior: Behavior normal.    ED Results / Procedures / Treatments   Labs (all labs ordered are listed, but only abnormal results are displayed) Labs Reviewed  COMPREHENSIVE METABOLIC PANEL - Abnormal; Notable for the following components:      Result Value   Glucose, Bld 124 (*)    BUN 21 (*)    Creatinine, Ser 1.26 (*)    All other components within normal limits  CBC - Abnormal; Notable for the following components:   WBC 12.5 (*)    All other components within normal limits  LIPASE, BLOOD   EKG None  Radiology CT ABDOMEN PELVIS W CONTRAST  Result Date: 03/21/2020 CLINICAL DATA:  44 year old male with abdominal pain. EXAM: CT ABDOMEN AND PELVIS WITH CONTRAST TECHNIQUE: Multidetector CT imaging of the abdomen and pelvis was performed using the Wrenn protocol following bolus administration of intravenous contrast. CONTRAST:  OMNIPAQUE IOHEXOL 300 MG/ML  SOLN COMPARISON:  Abdominal ultrasound dated 10/07/2015. FINDINGS: Lower chest: Minimal bibasilar atelectasis. The visualized lung bases are otherwise clear. No intra-abdominal free air or free fluid. Hepatobiliary: No focal liver abnormality is seen. No gallstones, gallbladder wall thickening, or biliary dilatation. Pancreas: Unremarkable. No pancreatic ductal dilatation or surrounding inflammatory changes. Spleen: Small scattered calcified splenic granuloma.  The spleen is otherwise unremarkable. Adrenals/Urinary Tract: The adrenal glands are unremarkable. There is a 3.5 cm left renal inferior pole cyst. There is no hydronephrosis on either side. There is symmetric enhancement and excretion of contrast by both kidneys. The visualized ureters and urinary bladder appear unremarkable. Stomach/Bowel: There is sigmoid diverticulosis and scattered colonic diverticula without active inflammatory changes. There is moderate stool throughout the colon. There is a 2 cm duodenal diverticulum without active inflammation. Multiple normal caliber fluid-filled loops of small bowel, likely physiologic. Mild enteritis is not excluded. Clinical correlation is recommended. There is no bowel obstruction. The appendix is normal. Vascular/Lymphatic: Mild aortoiliac atherosclerotic disease. The IVC is unremarkable. No portal venous gas. There is no adenopathy. Reproductive: The prostate and seminal vesicles are grossly unremarkable. No pelvic mass. Other: Small fat containing left inguinal hernia. No fluid collection. Musculoskeletal: Mild degenerative changes. No acute osseous pathology. IMPRESSION: 1. Normal caliber fluid-filled loops of small bowel, likely physiologic. Mild enteritis is not excluded. Clinical correlation is recommended. No bowel obstruction. Normal appendix. 2. Colonic diverticulosis. 3. Small fat containing left inguinal hernia. 4. Aortic Atherosclerosis (ICD10-I70.0). Electronically Signed   By: Elgie Collard M.D.   On: 03/21/2020 22:12    Procedures Procedures (including critical care time)  Medications Ordered in ED Medications  sodium chloride flush (NS) 0.9 % injection 3 mL (3 mLs Intravenous Not Given 03/21/20 1952)  iohexol (OMNIPAQUE) 300 MG/ML solution 100 mL (100 mLs Intravenous Contrast Given 03/21/20 2159)    ED Course  I have reviewed the triage vital signs and the nursing notes.  Pertinent labs & imaging results that were available during my  care of the patient were reviewed by me and considered in my medical decision making (see chart for details).    MDM Rules/Calculators/A&P  Pt is a 44 y.o. male that present with a history, physical exam, ED Clinical Course as noted above.   Patient presents today due to an episode of abdominal pain which has since alleviated.  Patient was recently diagnosed with an inguinal hernia.  I discussed imaging of his abdomen and patient request to move forward with that today.  CT scan does show a small fat-containing left inguinal hernia.  I discussed this with the patient.  He has no obvious palpable hernia noted on the physical exam today.  No testicular pain or scrotal swelling.  We will give the patient referral to general surgery.  He understands he can return to the emergency department with any new or worsening symptoms.  His questions were answered and he was amicable at the time of discharge.  His vital signs are stable.  He is afebrile today.  He was mildly tachycardic and hypertensive upon arrival but this is improved.  Not hypoxic.  Patient discharged to home/self care.  Condition at discharge: Stable  Note: Portions of this report may have been transcribed using voice recognition software. Every effort was made to ensure accuracy; however, inadvertent computerized transcription errors may be present.   Final Clinical Impression(s) / ED Diagnoses Final diagnoses:  Left inguinal hernia   Rx / DC Orders ED Discharge Orders    None       Placido SouJoldersma, Ethan Clayburn, PA-C 03/21/20 2233    Linwood DibblesKnapp, Jon, MD 03/22/20 1112

## 2020-03-21 NOTE — ED Triage Notes (Signed)
Pt reports generalized abd pain x 1 hour and vomited x 1.  States "I pulled my groin on the left side 5 weeks ago and had a hernia."  States he is unable to palpate hernia today.

## 2020-03-21 NOTE — ED Notes (Signed)
Patient transported to CT 

## 2021-06-22 IMAGING — CT CT ABD-PELV W/ CM
2 of 5 series · 16 of 46 positions shown, 18 images · IV contrast (APPLIED)
Comparison: Abdominal ultrasound dated 10/07/2015.

CLINICAL DATA: 44-year-old male with abdominal pain.

EXAM:
CT ABDOMEN AND PELVIS WITH CONTRAST
TECHNIQUE: Multidetector CT imaging of the abdomen and pelvis was performed
using the standard protocol following bolus administration of
intravenous contrast.
CONTRAST:  100mL OMNIPAQUE IOHEXOL 300 MG/ML  SOLN

[Series 3: abd/ pelvis 5.0 i30f 2 · axial · 0.84mm/px · z∈[+766,+1191]mm · 13 of 96 slices shown, 15 images]
[im 6/96  soft-tissue]
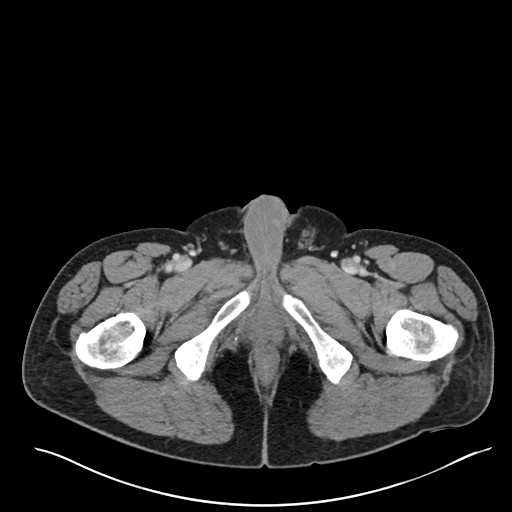
[im 6/96  bone]
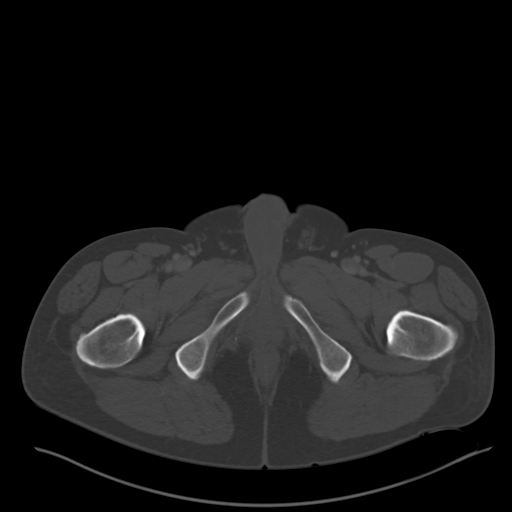
[im 16/96  soft-tissue]
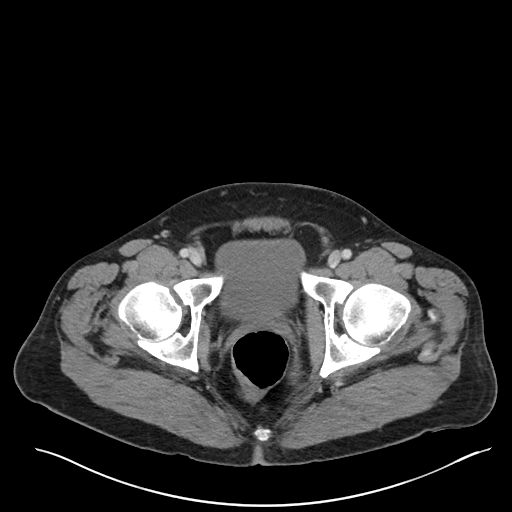
[im 21/96  soft-tissue]
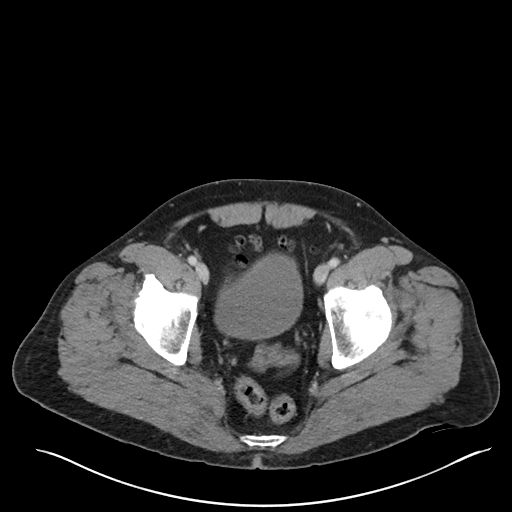
[im 26/96  soft-tissue]
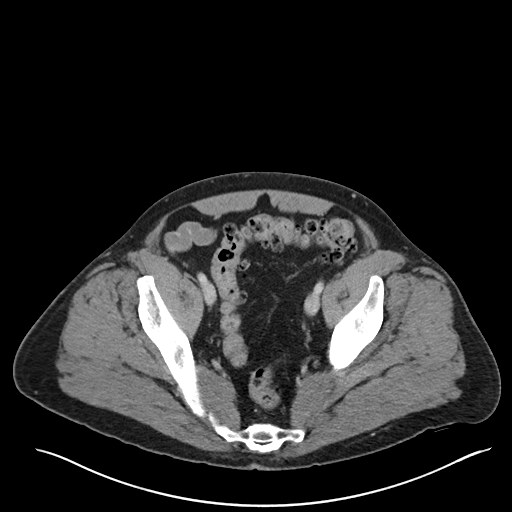
[im 36/96  soft-tissue]
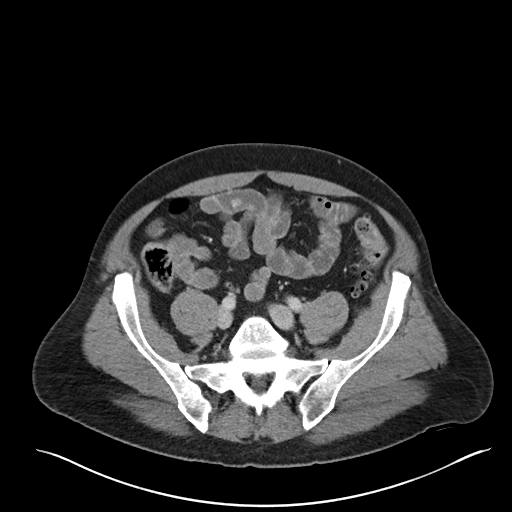
[im 41/96  soft-tissue]
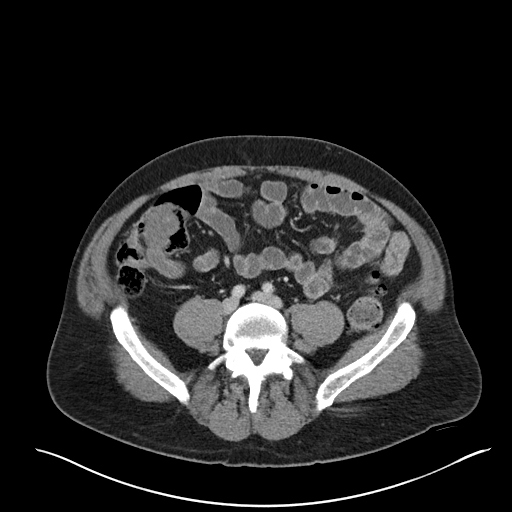
[im 51/96  soft-tissue]
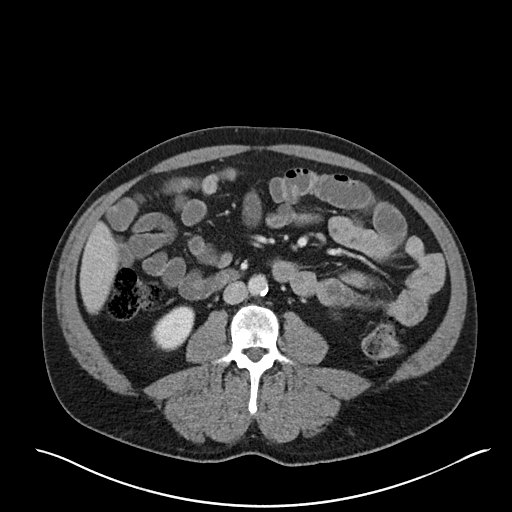
[im 56/96  soft-tissue]
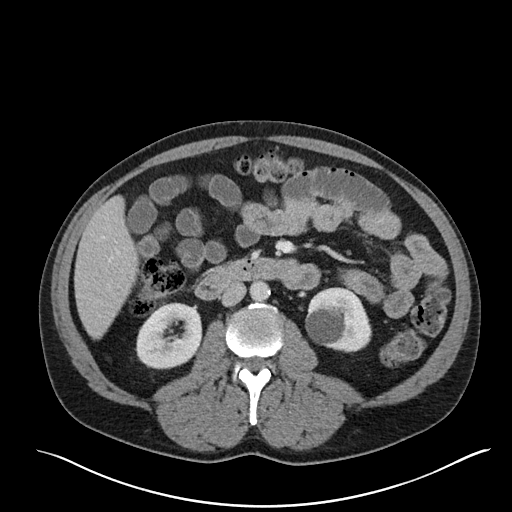
[im 61/96  soft-tissue]
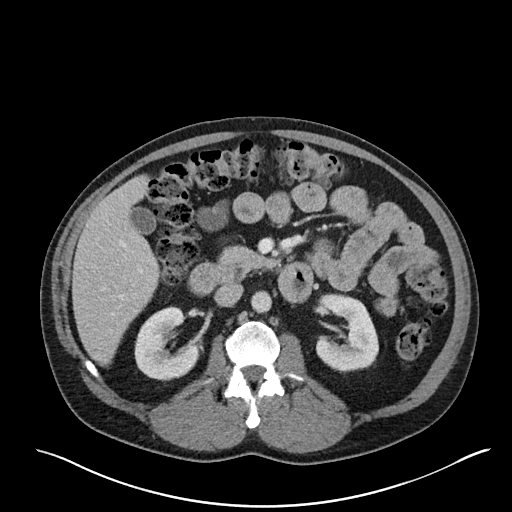
[im 61/96  bone]
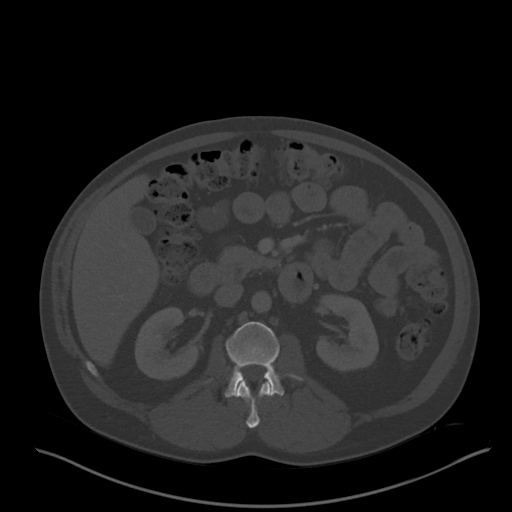
[im 71/96  soft-tissue]
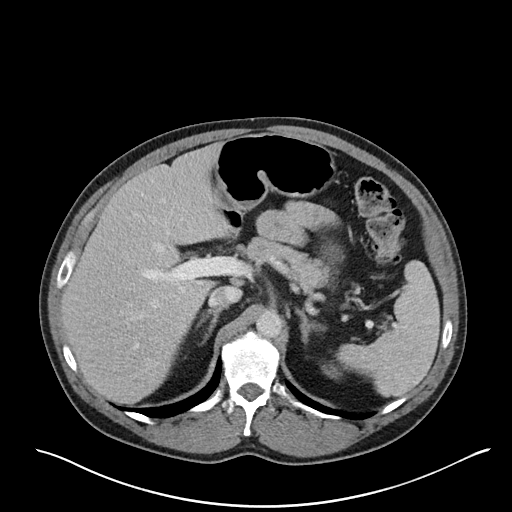
[im 76/96  soft-tissue]
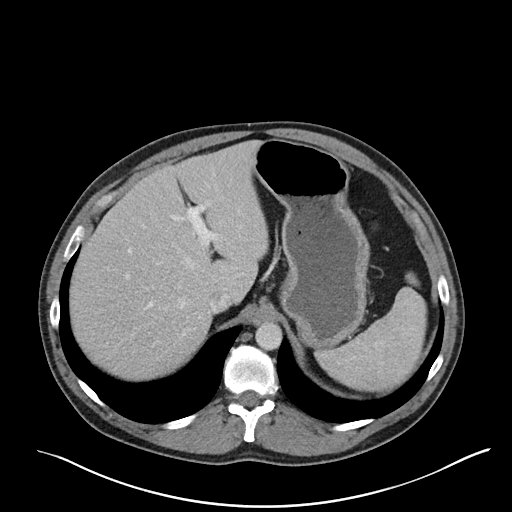
[im 81/96  soft-tissue]
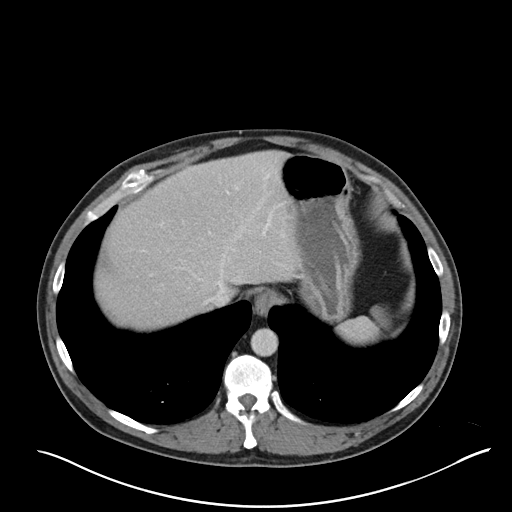
[im 91/96  soft-tissue]
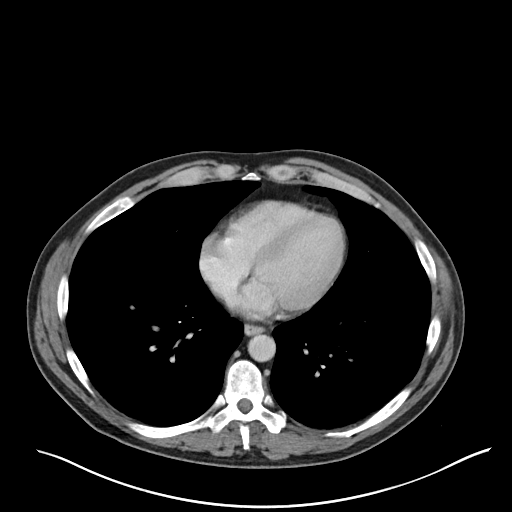

[Series 6: coronal soft tissue · coronal · 0.85mm/px · 3 of 115 slices shown]
[im 39/115  soft-tissue]
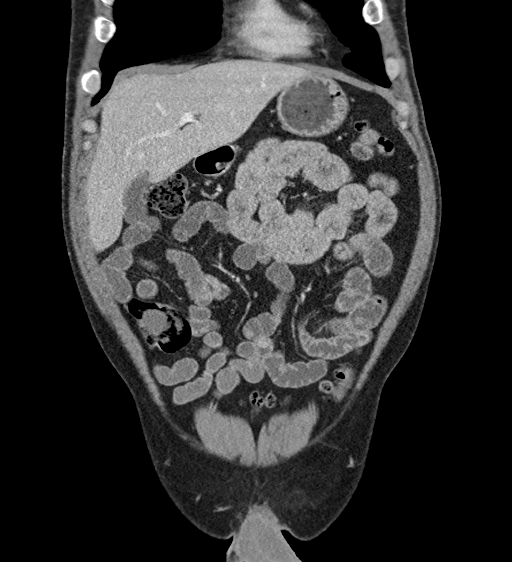
[im 51/115  soft-tissue]
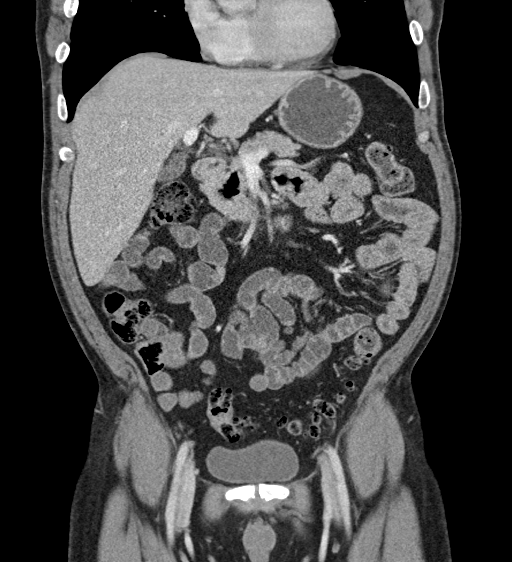
[im 64/115  soft-tissue]
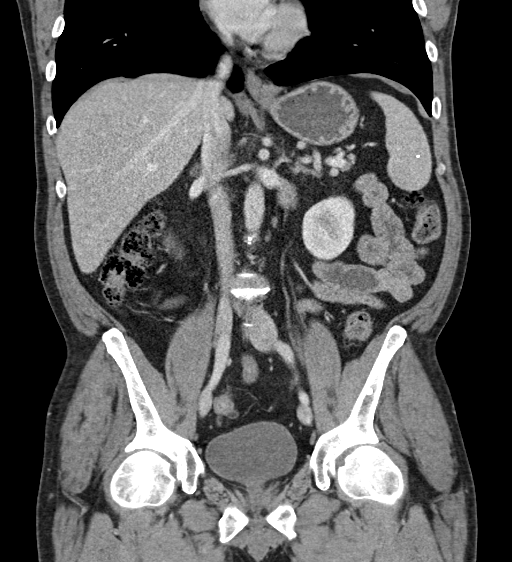

[16 of 46 positions shown; findings below may reference images not displayed]

FINDINGS: Lower chest: Minimal bibasilar atelectasis. The visualized lung
bases are otherwise clear.

No intra-abdominal free air or free fluid.

Hepatobiliary: No focal liver abnormality is seen. No gallstones,
gallbladder wall thickening, or biliary dilatation.

Pancreas: Unremarkable. No pancreatic ductal dilatation or
surrounding inflammatory changes.

Spleen: Small scattered calcified splenic granuloma. The spleen is
otherwise unremarkable.

Adrenals/Urinary Tract: The adrenal glands are unremarkable. There
is a 3.5 cm left renal inferior pole cyst. There is no
hydronephrosis on either side. There is symmetric enhancement and
excretion of contrast by both kidneys. The visualized ureters and
urinary bladder appear unremarkable.

Stomach/Bowel: There is sigmoid diverticulosis and scattered colonic
diverticula without active inflammatory changes. There is moderate
stool throughout the colon. There is a 2 cm duodenal diverticulum
without active inflammation. Multiple normal caliber fluid-filled
loops of small bowel, likely physiologic. Mild enteritis is not
excluded. Clinical correlation is recommended. There is no bowel
obstruction. The appendix is normal.

Vascular/Lymphatic: Mild aortoiliac atherosclerotic disease. The IVC
is unremarkable. No portal venous gas. There is no adenopathy.

Reproductive: The prostate and seminal vesicles are grossly
unremarkable. No pelvic mass.

Other: Small fat containing left inguinal hernia. No fluid
collection.

Musculoskeletal: Mild degenerative changes. No acute osseous
pathology.
IMPRESSION: 1. Normal caliber fluid-filled loops of small bowel, likely
physiologic. Mild enteritis is not excluded. Clinical correlation is
recommended. No bowel obstruction. Normal appendix.
2. Colonic diverticulosis.
3. Small fat containing left inguinal hernia.
4. Aortic Atherosclerosis (YYAB2-XX4.4).
